# Patient Record
Sex: Female | Born: 1985 | Race: White | Hispanic: No | Marital: Married | Smoking: Never smoker
Health system: Southern US, Community
[De-identification: ages and names within clinical notes are randomized; demographics above are authoritative.]

## PROBLEM LIST (undated history)

## (undated) DIAGNOSIS — O1490 Unspecified pre-eclampsia, unspecified trimester: Secondary | ICD-10-CM

## (undated) DIAGNOSIS — Z9289 Personal history of other medical treatment: Secondary | ICD-10-CM

## (undated) DIAGNOSIS — Z8041 Family history of malignant neoplasm of ovary: Secondary | ICD-10-CM

## (undated) DIAGNOSIS — O139 Gestational [pregnancy-induced] hypertension without significant proteinuria, unspecified trimester: Secondary | ICD-10-CM

## (undated) DIAGNOSIS — K219 Gastro-esophageal reflux disease without esophagitis: Secondary | ICD-10-CM

## (undated) HISTORY — DX: Family history of malignant neoplasm of ovary: Z80.41

## (undated) HISTORY — DX: Unspecified pre-eclampsia, unspecified trimester: O14.90

## (undated) HISTORY — DX: Personal history of other medical treatment: Z92.89

---

## 2005-03-12 ENCOUNTER — Emergency Department: Payer: Self-pay | Admitting: Internal Medicine

## 2005-08-08 ENCOUNTER — Ambulatory Visit: Payer: Self-pay | Admitting: Internal Medicine

## 2006-07-23 ENCOUNTER — Emergency Department: Payer: Self-pay | Admitting: Emergency Medicine

## 2006-12-31 ENCOUNTER — Observation Stay: Payer: Self-pay

## 2007-04-11 ENCOUNTER — Observation Stay: Payer: Self-pay | Admitting: Obstetrics and Gynecology

## 2007-04-13 ENCOUNTER — Inpatient Hospital Stay: Payer: Self-pay | Admitting: Obstetrics and Gynecology

## 2007-04-13 DIAGNOSIS — O1490 Unspecified pre-eclampsia, unspecified trimester: Secondary | ICD-10-CM

## 2007-04-13 DIAGNOSIS — D696 Thrombocytopenia, unspecified: Secondary | ICD-10-CM

## 2008-09-28 ENCOUNTER — Ambulatory Visit: Payer: Self-pay | Admitting: Obstetrics and Gynecology

## 2011-01-07 ENCOUNTER — Emergency Department: Payer: Self-pay | Admitting: Emergency Medicine

## 2012-10-25 ENCOUNTER — Emergency Department: Payer: Self-pay | Admitting: Emergency Medicine

## 2012-10-26 LAB — CBC
HCT: 38.5 % (ref 35.0–47.0)
HGB: 12.9 g/dL (ref 12.0–16.0)
MCH: 29.7 pg (ref 26.0–34.0)
MCHC: 33.5 g/dL (ref 32.0–36.0)
MCV: 89 fL (ref 80–100)
Platelet: 188 10*3/uL (ref 150–440)
RBC: 4.34 10*6/uL (ref 3.80–5.20)
RDW: 12.6 % (ref 11.5–14.5)

## 2012-10-26 LAB — BASIC METABOLIC PANEL
Anion Gap: 7 (ref 7–16)
BUN: 7 mg/dL (ref 7–18)
Chloride: 108 mmol/L — ABNORMAL HIGH (ref 98–107)
Co2: 25 mmol/L (ref 21–32)
Osmolality: 277 (ref 275–301)
Potassium: 3.3 mmol/L — ABNORMAL LOW (ref 3.5–5.1)

## 2014-02-19 DIAGNOSIS — I341 Nonrheumatic mitral (valve) prolapse: Secondary | ICD-10-CM | POA: Insufficient documentation

## 2014-02-19 DIAGNOSIS — R0681 Apnea, not elsewhere classified: Secondary | ICD-10-CM | POA: Insufficient documentation

## 2014-02-19 DIAGNOSIS — R0609 Other forms of dyspnea: Secondary | ICD-10-CM | POA: Insufficient documentation

## 2014-03-04 DIAGNOSIS — G43909 Migraine, unspecified, not intractable, without status migrainosus: Secondary | ICD-10-CM | POA: Insufficient documentation

## 2014-03-04 DIAGNOSIS — B001 Herpesviral vesicular dermatitis: Secondary | ICD-10-CM | POA: Insufficient documentation

## 2014-03-19 DIAGNOSIS — Z87898 Personal history of other specified conditions: Secondary | ICD-10-CM | POA: Insufficient documentation

## 2014-04-15 ENCOUNTER — Ambulatory Visit: Payer: Self-pay | Admitting: Emergency Medicine

## 2014-07-23 LAB — OB RESULTS CONSOLE RUBELLA ANTIBODY, IGM: Rubella: IMMUNE

## 2014-07-23 LAB — OB RESULTS CONSOLE RPR: RPR: NONREACTIVE

## 2014-07-23 LAB — OB RESULTS CONSOLE VARICELLA ZOSTER ANTIBODY, IGG: VARICELLA IGG: IMMUNE

## 2014-07-23 LAB — OB RESULTS CONSOLE ABO/RH: RH Type: POSITIVE

## 2014-07-23 LAB — OB RESULTS CONSOLE HIV ANTIBODY (ROUTINE TESTING): HIV: NONREACTIVE

## 2014-07-23 LAB — OB RESULTS CONSOLE ANTIBODY SCREEN: ANTIBODY SCREEN: NEGATIVE

## 2014-07-23 LAB — OB RESULTS CONSOLE HEPATITIS B SURFACE ANTIGEN: Hepatitis B Surface Ag: NEGATIVE

## 2014-11-04 ENCOUNTER — Encounter: Payer: Self-pay | Admitting: Obstetrics & Gynecology

## 2015-01-05 ENCOUNTER — Observation Stay: Payer: Self-pay

## 2015-01-05 LAB — URINALYSIS, COMPLETE
BILIRUBIN, UR: NEGATIVE
BLOOD: NEGATIVE
Glucose,UR: NEGATIVE mg/dL (ref 0–75)
LEUKOCYTE ESTERASE: NEGATIVE
Nitrite: NEGATIVE
Ph: 7 (ref 4.5–8.0)
Protein: NEGATIVE
RBC,UR: <1 /HPF (ref 0–5)
Specific Gravity: 1.01 (ref 1.003–1.030)
WBC UR: 1 /HPF (ref 0–5)

## 2015-01-05 LAB — FETAL FIBRONECTIN
Appearance: NORMAL
Fetal Fibronectin: NEGATIVE

## 2015-01-08 LAB — BETA STREP CULTURE(ARMC)

## 2015-02-22 NOTE — H&P (Signed)
L&D Evaluation:  History:  HPI 29 year old G2 P1001 with EDC=03/01/2015 by LMP=05/25/2014 and c/w  ultrasound presents at 2632 wk1 day with c/o frequent contractions that began last night. Denies recent IC, trauma, dysuria, diarrhea, constipation. No bleeding. Increased discharge today, but no burning or itching. Baby active. [facility] for prior C-section (preeclampsia with thrombocytopenia), thickened nuchal fold at 21 week scan, with a normal first trimester screen, and a hx of pp depression.   Presents with contractions   Patient's Surgical History Previous C-Section  2008   Medications Pre Natal Vitamins   Allergies NKDA   Social History none   Family History Non-Contributory   ROS:  ROS see HPI   Exam:  Vital Signs 129/65   General no apparent distress   Mental Status clear   Chest clear   Heart normal sinus rhythm, no murmur/gallop/rubs   Abdomen gravid, tender with contractions   Fetal Position cephalic   Edema no edema   Pelvic no external lesions, cervix closed and thick, and OOP   Mebranes Intact   FHT normal rate with no decels, 130 with accels to 150s   Ucx q2-4   Other wet prep negative   Impression:  Impression IUP at 32 1/7 weeks with threatened PTL.   Plan:  Plan EFM/NST, monitor contractions and for cervical change, IV hydration already begun. FFN and GBS done. UA pending. Terbutaline 0.6225mcg subcut x1 given.   Electronic Signatures: Trinna BalloonGutierrez, Jamita Mckelvin L (CNM)  (Signed 24-Mar-16 13:30)  Authored: L&D Evaluation   Last Updated: 24-Mar-16 13:30 by Trinna BalloonGutierrez, Christy Ehrsam L (CNM)

## 2015-02-23 ENCOUNTER — Encounter
Admission: RE | Admit: 2015-02-23 | Discharge: 2015-02-23 | Disposition: A | Discharge status: Home / Self Care | Payer: BLUE CROSS/BLUE SHIELD | Source: Ambulatory Visit | Attending: Obstetrics & Gynecology | Admitting: Obstetrics & Gynecology

## 2015-02-23 HISTORY — DX: Gastro-esophageal reflux disease without esophagitis: K21.9

## 2015-02-23 LAB — DIFFERENTIAL
BASOS PCT: 0 %
Basophils Absolute: 0 10*3/uL (ref 0–0.1)
Eosinophils Absolute: 0 10*3/uL (ref 0–0.7)
Eosinophils Relative: 0 %
Lymphocytes Relative: 13 %
Lymphs Abs: 0.9 10*3/uL — ABNORMAL LOW (ref 1.0–3.6)
MONO ABS: 0.7 10*3/uL (ref 0.2–0.9)
Monocytes Relative: 10 %
NEUTROS ABS: 5.3 10*3/uL (ref 1.4–6.5)
Neutrophils Relative %: 77 %

## 2015-02-23 LAB — CBC
HEMATOCRIT: 31.4 % — AB (ref 35.0–47.0)
HEMOGLOBIN: 9.9 g/dL — AB (ref 12.0–16.0)
MCH: 24.7 pg — ABNORMAL LOW (ref 26.0–34.0)
MCHC: 31.5 g/dL — ABNORMAL LOW (ref 32.0–36.0)
MCV: 78.6 fL — ABNORMAL LOW (ref 80.0–100.0)
Platelets: 114 10*3/uL — ABNORMAL LOW (ref 150–440)
RBC: 4 MIL/uL (ref 3.80–5.20)
RDW: 15.1 % — AB (ref 11.5–14.5)
WBC: 6.8 10*3/uL (ref 3.6–11.0)

## 2015-02-23 LAB — TYPE AND SCREEN
ABO/RH(D): A POS
Antibody Screen: NEGATIVE

## 2015-02-23 LAB — ABO/RH: ABO/RH(D): A POS

## 2015-02-23 MED ORDER — BUPIVACAINE 0.5 % ON-Q PUMP DUAL CATH 300 ML: 300.0000 mL | INJECTION | Status: DC

## 2015-02-23 MED ORDER — CEFOXITIN SODIUM-DEXTROSE 2-2.2 GM-% IV SOLR (PREMIX): 2.0000 g | Freq: Once | INTRAVENOUS | Status: DC

## 2015-02-23 MED ORDER — LACTATED RINGERS IV SOLN: INTRAVENOUS | Status: DC

## 2015-02-23 NOTE — Discharge Instructions (Signed)
Herbert SetaHeather D Haupt   Your procedure is scheduled on: 02/24/15   [facility]  Cesarean delivery is the birth of a baby through a cut (incision) in the abdomen and womb (uterus).  LET [facility] PROVIDER KNOW ABOUT:  All medicines you are taking, including vitamins, herbs, eye drops, creams, and over-the-counter medicines.  Previous problems you or members of your family have had with the use of anesthetics.  Any blood disorders you have.  Previous surgeries you have had.  Medical conditions you have.  Any allergies you have.  Complicationsinvolving the pregnancy. RISKS AND COMPLICATIONS  Generally, this is a safe procedure. However, as with any procedure, complications can occur. Possible complications include:  Bleeding.  Infection.  Blood clots.  Injury to surrounding organs.  Problems with anesthesia.  Injury to the baby. BEFORE THE PROCEDURE   You may be given an antacid medicine to drink. This will prevent acid contents in your stomach from going into your lungs if you vomit during the surgery.  You may be given an antibiotic medicine to prevent infection. PROCEDURE   Hair may be removed from your pubic area and your lower abdomen. This is to prevent infection in the incision site.  A tube (Foley catheter) will be placed in your bladder to drain your urine from your bladder into a bag. This keeps your bladder empty during surgery.  An IV tube will be placed in your vein.  You may be given medicine to numb the lower half of your body (regional anesthetic). If you were in labor, you may have already had an epidural in place which can be used in both labor and cesarean delivery. You may possibly be given medicine to make you sleep (general anesthetic) though this is not as common.  An incision will be made in your abdomen that extends to your uterus. There are 2 basic kinds of incisions:  The horizontal  (transverse) incision. Horizontal incisions are from side to side and are used for most routine cesarean deliveries.  The vertical incision. The vertical incision is from the top of the abdomen to the bottom and is less commonly used. It is often done for women who have a serious complication (extreme prematurity) or under emergency situations.  The horizontal and vertical incisions may both be used at the same time. However, this is very uncommon.  An incision is then made in your uterus to deliver the baby.  Your baby will then be delivered.  Both incisions are then closed with absorbable stitches. AFTER THE PROCEDURE   If you were awake during the surgery, you will see your baby right away. If you were asleep, you will see your baby as soon as you are awake.  You may breastfeed your baby after surgery.  You may be able to get up and walk the same day as the surgery. If you need to stay in bed for a period of time, you will receive help to turn, cough, and take deep breaths after surgery. This helps prevent lung problems such as pneumonia.  Do not get out of bed alone the first time after surgery. You will need help getting out of bed until you are able to do this by yourself.  You may be able to shower the day after your cesarean delivery. After the bandage (dressing) is taken off the incision site, a nurse will assist you to  shower if you would like help.  You will have pneumatic compression hose placed on your lower legs. This is done to prevent blood clots. When you are up and walking regularly, they will no longer be necessary.  Do not cross your legs when you sit.  Save any blood clots that you pass. If you pass a clot while on the toilet, do not flush it. Call for the nurse. Tell the nurse if you think you are bleeding too much or passing too many clots.  You will be given medicine as needed. Let your health care providers know if you are hurting. You may also be given an  antibiotic to prevent an infection.  Your IV tube will be taken out when you are drinking a reasonable amount of fluids. The Foley catheter is taken out when you are up and walking.  If your blood type is Rh negative and your baby's blood type is Rh positive, you will be given a shot of anti-D immune globulin. This shot prevents you from having Rh problems with a future pregnancy. You should get the shot even if you had your tubes tied (tubal ligation).  If you are allowed to take the baby for a walk, place the baby in the bassinet and push it. Do not carry your baby in your arms. Document Released: 10/01/2005 Document Revised: 07/22/2013 Document Reviewed: 04/22/2013 [facility] Patient Information 2015 [place], [place]. This information is not intended to replace advice given to you by your health care provider. Make sure you discuss any questions you have with your health care provider. Incentive Spirometer An incentive spirometer is a tool that can help keep your lungs clear and active. This tool measures how well you are filling your lungs with each breath. Taking long, deep breaths may help reverse or decrease the chance of developing breathing (pulmonary) problems (especially infection) following:  Surgery of the chest or abdomen.  Surgery if you have a history of smoking or a lung problem.  A long period of time when you are unable to move or be active. BEFORE THE PROCEDURE   If the spirometer includes an indicator to show your best effort, your nurse or respiratory therapist will set it to a desired goal.  If possible, sit up straight or lean slightly forward. Try not to slouch.  Hold the incentive spirometer in an upright position. INSTRUCTIONS FOR USE   Sit on the edge of your bed if possible, or sit up as far as you can in bed or on a chair.  Hold the incentive spirometer in an upright position.  Breathe out normally.  Place the mouthpiece in your mouth and seal your lips  tightly around it.  Breathe in slowly and as deeply as possible, raising the piston or the ball toward the top of the column.  Hold your breath for 3-5 seconds or for as long as possible. Allow the piston or ball to fall to the bottom of the column.  Remove the mouthpiece from your mouth and breathe out normally.  Rest for a few seconds and repeat Steps 1 through 7 at least 10 times every 1-2 hours when you are awake. Take your time and take a few normal breaths between deep breaths.  The spirometer may include an indicator to show your best effort. Use the indicator as a goal to work toward during each repetition.  After each set of 10 deep breaths, practice coughing to be sure your lungs are clear. If you have  an incision (the cut made at the time of surgery), support your incision when coughing by placing a pillow or rolled-up towels firmly against it. Once you are able to get out of bed, walk around indoors and cough well. You may stop using the incentive spirometer when instructed by your caregiver.  RISKS AND COMPLICATIONS  Breathing too quickly may cause dizziness. At an extreme, this could cause you to pass out. Take your time so you do not get dizzy or light-headed.  If you are in pain, you may need to take or ask for pain medication before doing incentive spirometry. It is harder to take a deep breath if you are having pain. AFTER USE  Rest and breathe slowly and easily.  It can be helpful to keep a log of your progress. Your caregiver can provide you with a simple table to help with this. If you are using the spirometer at home, follow these instructions: SEEK MEDICAL CARE IF:   You are having difficultly using the spirometer.  You have trouble using the spirometer as often as instructed.  Your pain medication is not giving enough relief while using the spirometer.  You develop fever of 100.97F (38.1C) or higher. SEEK IMMEDIATE MEDICAL CARE IF:   You cough up bloody  sputum that had not been present before.  You develop fever of 102F (38.9C) or greater.  You develop worsening pain at or near the incision site. MAKE SURE YOU:   Understand these instructions.  Will watch your condition.  Will get help right away if you are not doing well or get worse. Document Released: 02/11/2007 Document Revised: 02/15/2014 Document Reviewed: 04/14/2007 [facility] Patient Information 2015 [place], [place]. This information is not intended to replace advice given to you by your health care provider. Make sure you discuss any questions you have with your health care provider. Surgical Site Infections FAQs What is a Surgical Site Infection (SSI)? A surgical site infection is an infection that occurs after surgery in the part of the body where the surgery took place. Most patients who have surgery do not develop an infection. However, infections develop in about 1 to 3 out of every 100 patients who have surgery. Some of the common symptoms of a surgical site infection are:  Redness and pain around the area where you had surgery  Drainage of cloudy fluid from your surgical wound  Fever Can SSIs be treated? Yes. Most surgical site infections can be treated with antibiotics. The antibiotic given to you depends on the bacteria (germs) causing the infections. Sometimes patients with SSIs also need another surgery to treat the infection. What are some of the things that hospitals are doing to prevent SSIs? To prevent SSIs, doctors, nurses, and other healthcare providers:  Clean their hands and arms up to their elbows with an antiseptic agent just before the surgery.  Clean their hands with soap and water or an alcohol-based hand rub before and after caring for each patient.  May remove some of your hair immediately before your surgery using electric clippers if the hair is in the same area where the procedure will occur. They should not shave you with a razor.  Wear  special hair covers, masks, gowns, and gloves during surgery to keep the surgery area clean.  Give you antibiotics before your surgery starts. In most cases, you should get antibiotics within 60 minutes before the surgery starts and the antibiotics should be stopped within 24 hours after surgery.  Clean the skin  at the site of your surgery with a special soap that kills germs. What can I do to help prevent SSIs? Before your surgery:  Tell your doctor about other medical problems you may have. Health problems such as allergies, diabetes, and obesity could affect your surgery and your treatment.  Quit smoking. Patients who smoke get more infections. Talk to your doctor about how you can quit before your surgery.  Do not shave near where you will have surgery. Shaving with a razor can irritate your skin and make it easier to develop an infection. At the time of your surgery:  Speak up if someone tries to shave you with a razor before surgery. Ask why you need to be shaved and talk with your surgeon if you have any concerns.  Ask if you will get antibiotics before surgery. After your surgery:  Make sure that your healthcare providers clean their hands before examining you, either with soap and water or an alcohol-based hand rub.  If you do not see your providers clean their hands, please ask them to do so.  Family and friends who visit you should not touch the surgical wound or dressings.  Family and friends should clean their hands with soap and water or an alcohol-based hand rub before and after visiting you. If you do not see them clean their hands, ask them to clean their hands. What do I need to do when I go home from the hospital?  Before you go home, your doctor or nurse should explain everything you need to know about taking care of your wound. Make sure you understand how to care for your wound before you leave the hospital.  Always clean your hands before and after caring for  your wound.  Before you go home, make sure you know who to contact if you have questions or problems after you get home.  If you have any symptoms of an infection, such as redness and pain at the surgery site, drainage, or fever, call your doctor immediately. If you have additional questions, please ask your doctor or nurse. Developed and co-sponsored by [employer] for [employer] of [place] [phone]); Infectious Diseases Society of America (IDSA); [facility] Association; Association for Professionals in Infection Control and Epidemiology (APIC); Centers for Disease Control and Prevention (CDC); and [employer] Document Released: 10/06/2013 Document Reviewed: 10/06/2013 [facility] Patient Information 2015 [place], [place]. This information is not intended to replace advice given to you by your health care provider. Make sure you discuss any questions you have with your health care provider. Trudie Reed             [address]             [place], [place] [address]  Call this number if you have problems the morning of surgery: [phone]   Remember:   Do not eat food or drink liquids after midnight.     Do not wear jewelry, make-up or nail polish.  Do not wear lotions, powders, or perfumes. You may wear deodorant.  Do not shave prior to surgery.  Do not bring valuables to the hospital.  [facility] is not responsible for any            belongings or valuables.                Contacts, dentures or bridgework may not be worn  into surgery.  Leave suitcase in the car. After surgery it may be brought to your room.  For patients admitted to the hospital, discharge time is determined by your             treatment team.               Special Instructions: Preparing the skin before Cesarean Section              To help prevent the risk of infection at your surgical site, we are providing             you with rinse-free Sage 2%  Chlorhexidine Gluconate (HCG) disposable             wipes.               The night before surgery:              1. Shower or bathe with warm water             2. Do not apply lotion or perfume             3. Wait one hour after shower, skin should be dry and cool             4. Open Sage wipe package - 2 disposable cloths are inside             5. Wipe the lower abdomen from the pubic line to the navel and hip bone to hip             bone with one cloth             6. Use the second cloth to wipe the front of the upper thighs             7. Allow the area to dry for one minute. DO NOT RINSE             8. Skin may feel "tacky" for several minutes             9. Dress in freshly laundered, clean clothes           10. Do not shower the morning of surgery    Please read over the following fact sheets that you were given: Coughing and            Deep Breathing and Surgical Site Infection Prevention

## 2015-02-24 ENCOUNTER — Inpatient Hospital Stay: Payer: BLUE CROSS/BLUE SHIELD | Admitting: Anesthesiology

## 2015-02-24 ENCOUNTER — Inpatient Hospital Stay
Admission: RE | Admit: 2015-02-24 | Payer: BLUE CROSS/BLUE SHIELD | Source: Ambulatory Visit | Admitting: Obstetrics & Gynecology

## 2015-02-24 ENCOUNTER — Inpatient Hospital Stay
Admission: RE | Admit: 2015-02-24 | Discharge: 2015-02-26 | DRG: 766 | Disposition: A | Discharge status: Home / Self Care | Payer: BLUE CROSS/BLUE SHIELD | Attending: Obstetrics & Gynecology | Admitting: Obstetrics & Gynecology

## 2015-02-24 ENCOUNTER — Encounter
Admission: RE | Disposition: A | Discharge status: Home / Self Care | Payer: Self-pay | Source: Home / Self Care | Attending: Obstetrics & Gynecology

## 2015-02-24 DIAGNOSIS — O34219 Maternal care for unspecified type scar from previous cesarean delivery: Secondary | ICD-10-CM

## 2015-02-24 DIAGNOSIS — Z302 Encounter for sterilization: Secondary | ICD-10-CM

## 2015-02-24 DIAGNOSIS — Z3490 Encounter for supervision of normal pregnancy, unspecified, unspecified trimester: Secondary | ICD-10-CM

## 2015-02-24 DIAGNOSIS — O3421 Maternal care for scar from previous cesarean delivery: Principal | ICD-10-CM | POA: Diagnosis present

## 2015-02-24 DIAGNOSIS — Z3A39 39 weeks gestation of pregnancy: Secondary | ICD-10-CM | POA: Diagnosis present

## 2015-02-24 DIAGNOSIS — F329 Major depressive disorder, single episode, unspecified: Secondary | ICD-10-CM | POA: Insufficient documentation

## 2015-02-24 DIAGNOSIS — Z98891 History of uterine scar from previous surgery: Secondary | ICD-10-CM

## 2015-02-24 DIAGNOSIS — F32A Depression, unspecified: Secondary | ICD-10-CM | POA: Insufficient documentation

## 2015-02-24 HISTORY — DX: Gestational (pregnancy-induced) hypertension without significant proteinuria, unspecified trimester: O13.9

## 2015-02-24 LAB — HIV ANTIBODY (ROUTINE TESTING W REFLEX): HIV Screen 4th Generation wRfx: NONREACTIVE

## 2015-02-24 LAB — RPR: RPR Ser Ql: NONREACTIVE

## 2015-02-24 SURGERY — Surgical Case
Anesthesia: Spinal

## 2015-02-24 MED ORDER — DIPHENHYDRAMINE HCL 50 MG/ML IJ SOLN: 12.5000 mg | INTRAMUSCULAR | Status: DC | PRN

## 2015-02-24 MED ORDER — DIPHENHYDRAMINE HCL 25 MG PO CAPS: 25.0000 mg | ORAL_CAPSULE | Freq: Four times a day (QID) | ORAL | Status: DC | PRN

## 2015-02-24 MED ORDER — SIMETHICONE 80 MG PO CHEW: 80.0000 mg | CHEWABLE_TABLET | ORAL | Status: DC | PRN

## 2015-02-24 MED ORDER — NALBUPHINE HCL 10 MG/ML IJ SOLN: 5.0000 mg | Freq: Once | INTRAMUSCULAR | Status: AC | PRN

## 2015-02-24 MED ORDER — HYDROCODONE-ACETAMINOPHEN 5-325 MG PO TABS: 1.0000 | ORAL_TABLET | ORAL | Status: AC | PRN

## 2015-02-24 MED ORDER — KETOROLAC TROMETHAMINE 15 MG/ML IJ SOLN
INTRAMUSCULAR | Status: AC
  Administered 2015-02-24: 15 mg via INTRAVENOUS
  Filled 2015-02-24: qty 1

## 2015-02-24 MED ORDER — OXYCODONE-ACETAMINOPHEN 5-325 MG PO TABS
1.0000 | ORAL_TABLET | ORAL | Status: DC | PRN
  Administered 2015-02-26 (×3): 1 via ORAL
  Filled 2015-02-24 (×4): qty 1

## 2015-02-24 MED ORDER — SODIUM CHLORIDE 0.9 % IV SOLN
6.2500 mg | Freq: Four times a day (QID) | INTRAVENOUS | Status: DC | PRN
  Filled 2015-02-24: qty 1
  Filled 2015-02-24: qty 0.25

## 2015-02-24 MED ORDER — BUPIVACAINE 0.25 % ON-Q PUMP DUAL CATH 400 ML: 400.0000 mL | INJECTION | Status: DC

## 2015-02-24 MED ORDER — LACTATED RINGERS IV SOLN
INTRAVENOUS | Status: DC
  Administered 2015-02-25 – 2015-02-26 (×3): via INTRAVENOUS

## 2015-02-24 MED ORDER — ONDANSETRON HCL 4 MG/2ML IJ SOLN
4.0000 mg | Freq: Three times a day (TID) | INTRAMUSCULAR | Status: DC | PRN
  Administered 2015-02-26: 4 mg via INTRAVENOUS
  Filled 2015-02-24: qty 2

## 2015-02-24 MED ORDER — MEPERIDINE HCL 25 MG/ML IJ SOLN: 6.2500 mg | INTRAMUSCULAR | Status: DC | PRN

## 2015-02-24 MED ORDER — DIBUCAINE 1 % RE OINT: 1.0000 "application " | TOPICAL_OINTMENT | RECTAL | Status: DC | PRN

## 2015-02-24 MED ORDER — CEFOXITIN SODIUM-DEXTROSE 2-2.2 GM-% IV SOLR (PREMIX)
INTRAVENOUS | Status: AC
  Administered 2015-02-24: 2000 mg via INTRAVENOUS
  Filled 2015-02-24: qty 50

## 2015-02-24 MED ORDER — CEFOXITIN SODIUM-DEXTROSE 2-2.2 GM-% IV SOLR (PREMIX)
2.0000 g | Freq: Once | INTRAVENOUS | Status: AC
  Administered 2015-02-24: 2000 mg via INTRAVENOUS

## 2015-02-24 MED ORDER — LACTATED RINGERS IV SOLN
INTRAVENOUS | Status: DC
  Administered 2015-02-24 (×2): via INTRAVENOUS

## 2015-02-24 MED ORDER — CITRIC ACID-SODIUM CITRATE 334-500 MG/5ML PO SOLN
ORAL | Status: AC
  Administered 2015-02-24: 30 mL via ORAL
  Filled 2015-02-24: qty 15

## 2015-02-24 MED ORDER — HYDROMORPHONE HCL 1 MG/ML IJ SOLN: 0.2500 mg | INTRAMUSCULAR | Status: DC | PRN

## 2015-02-24 MED ORDER — BUPIVACAINE 0.25 % ON-Q PUMP DUAL CATH 400 ML
INJECTION | Status: AC
  Filled 2015-02-24: qty 400

## 2015-02-24 MED ORDER — BUPIVACAINE HCL 0.25 % IJ SOLN
10.0000 mL | Freq: Once | INTRAMUSCULAR | Status: DC
Start: 2015-02-24 — End: 2015-02-24
  Filled 2015-02-24: qty 10

## 2015-02-24 MED ORDER — FERROUS SULFATE 325 (65 FE) MG PO TABS
325.0000 mg | ORAL_TABLET | Freq: Two times a day (BID) | ORAL | Status: DC
  Administered 2015-02-25 – 2015-02-26 (×3): 325 mg via ORAL
  Filled 2015-02-24 (×4): qty 1

## 2015-02-24 MED ORDER — FENTANYL CITRATE (PF) 100 MCG/2ML IJ SOLN
25.0000 ug | INTRAMUSCULAR | Status: AC | PRN
  Administered 2015-02-24 (×4): 25 ug via INTRAVENOUS

## 2015-02-24 MED ORDER — KETOROLAC TROMETHAMINE 15 MG/ML IJ SOLN
15.0000 mg | Freq: Four times a day (QID) | INTRAMUSCULAR | Status: AC
  Administered 2015-02-24 – 2015-02-25 (×2): 15 mg via INTRAVENOUS
  Filled 2015-02-24: qty 1

## 2015-02-24 MED ORDER — MENTHOL 3 MG MT LOZG
1.0000 | LOZENGE | OROMUCOSAL | Status: DC | PRN
Start: 2015-02-24 — End: 2015-02-26
  Filled 2015-02-24: qty 9

## 2015-02-24 MED ORDER — WITCH HAZEL-GLYCERIN EX PADS: 1.0000 "application " | MEDICATED_PAD | CUTANEOUS | Status: DC | PRN

## 2015-02-24 MED ORDER — OXYCODONE-ACETAMINOPHEN 5-325 MG PO TABS
2.0000 | ORAL_TABLET | ORAL | Status: DC | PRN
  Administered 2015-02-26: 1 via ORAL

## 2015-02-24 MED ORDER — PHENYLEPHRINE HCL 10 MG/ML IJ SOLN
INTRAMUSCULAR | Status: DC | PRN
  Administered 2015-02-24 (×2): 100 ug via INTRAVENOUS

## 2015-02-24 MED ORDER — ONDANSETRON HCL 4 MG/2ML IJ SOLN
INTRAMUSCULAR | Status: AC
  Administered 2015-02-24: 4 mg via INTRAVENOUS
  Filled 2015-02-24: qty 2

## 2015-02-24 MED ORDER — FENTANYL CITRATE (PF) 100 MCG/2ML IJ SOLN
INTRAMUSCULAR | Status: AC
  Filled 2015-02-24: qty 2

## 2015-02-24 MED ORDER — OXYTOCIN 40 UNITS IN LACTATED RINGERS INFUSION - SIMPLE MED
INTRAVENOUS | Status: AC
  Administered 2015-02-24: 100 mL via INTRAVENOUS
  Administered 2015-02-24: 62.5 mL/h via INTRAVENOUS
  Filled 2015-02-24: qty 1000

## 2015-02-24 MED ORDER — DIPHENHYDRAMINE HCL 25 MG PO CAPS: 25.0000 mg | ORAL_CAPSULE | ORAL | Status: DC | PRN

## 2015-02-24 MED ORDER — BUPIVACAINE HCL (PF) 0.5 % IJ SOLN
INTRAMUSCULAR | Status: AC
  Administered 2015-02-24: 10 mL via INTRAMUSCULAR
  Filled 2015-02-24: qty 30

## 2015-02-24 MED ORDER — NALOXONE HCL 1 MG/ML IJ SOLN
1.0000 ug/kg/h | INTRAVENOUS | Status: DC | PRN
  Filled 2015-02-24: qty 2

## 2015-02-24 MED ORDER — SIMETHICONE 80 MG PO CHEW
80.0000 mg | CHEWABLE_TABLET | ORAL | Status: DC
  Administered 2015-02-25: 80 mg via ORAL
  Filled 2015-02-24: qty 1

## 2015-02-24 MED ORDER — SCOPOLAMINE 1 MG/3DAYS TD PT72: 1.0000 | MEDICATED_PATCH | Freq: Once | TRANSDERMAL | Status: DC

## 2015-02-24 MED ORDER — SIMETHICONE 80 MG PO CHEW
80.0000 mg | CHEWABLE_TABLET | Freq: Three times a day (TID) | ORAL | Status: DC
  Administered 2015-02-25 – 2015-02-26 (×4): 80 mg via ORAL
  Filled 2015-02-24 (×4): qty 1

## 2015-02-24 MED ORDER — SODIUM CHLORIDE 0.9 % IJ SOLN: 3.0000 mL | INTRAMUSCULAR | Status: DC | PRN

## 2015-02-24 MED ORDER — NALOXONE HCL 0.4 MG/ML IJ SOLN: 0.4000 mg | INTRAMUSCULAR | Status: DC | PRN

## 2015-02-24 MED ORDER — IBUPROFEN 600 MG PO TABS
600.0000 mg | ORAL_TABLET | Freq: Four times a day (QID) | ORAL | Status: DC
  Administered 2015-02-26 (×2): 600 mg via ORAL
  Filled 2015-02-24 (×2): qty 1

## 2015-02-24 MED ORDER — PRENATAL MULTIVITAMIN CH
1.0000 | ORAL_TABLET | Freq: Every day | ORAL | Status: DC
  Administered 2015-02-25 – 2015-02-26 (×2): 1 via ORAL
  Filled 2015-02-24 (×2): qty 1

## 2015-02-24 MED ORDER — SENNOSIDES-DOCUSATE SODIUM 8.6-50 MG PO TABS: 2.0000 | ORAL_TABLET | ORAL | Status: DC

## 2015-02-24 MED ORDER — OXYTOCIN 40 UNITS IN LACTATED RINGERS INFUSION - SIMPLE MED
62.5000 mL/h | INTRAVENOUS | Status: AC
  Administered 2015-02-24 (×2): 62.5 mL/h via INTRAVENOUS
  Filled 2015-02-24: qty 1000

## 2015-02-24 MED ORDER — BUPIVACAINE HCL (PF) 0.5 % IJ SOLN
10.0000 mL | Freq: Once | INTRAMUSCULAR | Status: DC
  Administered 2015-02-24: 10 mL via INTRAMUSCULAR

## 2015-02-24 MED ORDER — IBUPROFEN 600 MG PO TABS
600.0000 mg | ORAL_TABLET | Freq: Four times a day (QID) | ORAL | Status: DC | PRN
  Administered 2015-02-25 (×2): 600 mg via ORAL
  Filled 2015-02-24 (×2): qty 1

## 2015-02-24 MED ORDER — POTASSIUM CITRATE-CITRIC ACID 1100-334 MG/5ML PO SOLN
10.0000 meq | Freq: Once | ORAL | Status: AC
  Filled 2015-02-24: qty 5

## 2015-02-24 MED ORDER — MORPHINE SULFATE 2 MG/ML IJ SOLN: 1.0000 mg | Freq: Once | INTRAMUSCULAR | Status: AC | PRN

## 2015-02-24 MED ORDER — ZOLPIDEM TARTRATE 5 MG PO TABS: 5.0000 mg | ORAL_TABLET | Freq: Every evening | ORAL | Status: DC | PRN

## 2015-02-24 MED ORDER — LACTATED RINGERS IV SOLN
Freq: Once | INTRAVENOUS | Status: AC
  Administered 2015-02-24: 09:00:00 via INTRAVENOUS

## 2015-02-24 MED ORDER — NALBUPHINE HCL 10 MG/ML IJ SOLN: 5.0000 mg | INTRAMUSCULAR | Status: DC | PRN

## 2015-02-24 MED ORDER — IBUPROFEN 600 MG PO TABS: 600.0000 mg | ORAL_TABLET | Freq: Four times a day (QID) | ORAL | Status: DC

## 2015-02-24 MED ORDER — LANOLIN HYDROUS EX OINT: 1.0000 "application " | TOPICAL_OINTMENT | CUTANEOUS | Status: DC | PRN

## 2015-02-24 MED ORDER — ACETAMINOPHEN 325 MG PO TABS
650.0000 mg | ORAL_TABLET | ORAL | Status: DC | PRN
  Administered 2015-02-25 – 2015-02-26 (×4): 650 mg via ORAL
  Filled 2015-02-24 (×4): qty 2

## 2015-02-24 MED ORDER — PROMETHAZINE HCL 25 MG/ML IJ SOLN
6.2500 mg | Freq: Four times a day (QID) | INTRAMUSCULAR | Status: AC | PRN
  Administered 2015-02-24: 6.25 mg via INTRAVENOUS

## 2015-02-24 MED ORDER — ONDANSETRON HCL 4 MG/2ML IJ SOLN
4.0000 mg | Freq: Once | INTRAMUSCULAR | Status: AC | PRN
  Administered 2015-02-24: 4 mg via INTRAVENOUS

## 2015-02-24 SURGICAL SUPPLY — 22 items
BARRIER ADHS 3X4 INTERCEED (GAUZE/BANDAGES/DRESSINGS) ×3 IMPLANT
CANISTER SUCT 3000ML (MISCELLANEOUS) ×3 IMPLANT
CATH KIT ON-Q SILVERSOAK 5IN (CATHETERS) ×6 IMPLANT
CHLORAPREP W/TINT 26ML (MISCELLANEOUS) ×6 IMPLANT
GLOVE SKINSENSE NS SZ8.0 LF (GLOVE) ×2
GLOVE SKINSENSE STRL SZ8.0 LF (GLOVE) ×1 IMPLANT
GOWN STRL REUS W/ TWL LRG LVL3 (GOWN DISPOSABLE) ×1 IMPLANT
GOWN STRL REUS W/ TWL XL LVL3 (GOWN DISPOSABLE) ×2 IMPLANT
GOWN STRL REUS W/TWL LRG LVL3 (GOWN DISPOSABLE) ×2
GOWN STRL REUS W/TWL XL LVL3 (GOWN DISPOSABLE) ×4
LIQUID BAND (GAUZE/BANDAGES/DRESSINGS) ×3 IMPLANT
NS IRRIG 1000ML POUR BTL (IV SOLUTION) ×3 IMPLANT
PACK C SECTION AR (MISCELLANEOUS) ×3 IMPLANT
PAD GROUND ADULT SPLIT (MISCELLANEOUS) ×3 IMPLANT
PAD OB MATERNITY 4.3X12.25 (PERSONAL CARE ITEMS) ×3 IMPLANT
PAD PREP 24X41 OB/GYN DISP (PERSONAL CARE ITEMS) ×3 IMPLANT
SUT MAXON ABS #0 GS21 30IN (SUTURE) ×6 IMPLANT
SUT VIC AB 1 CT1 36 (SUTURE) ×3 IMPLANT
SUT VIC AB 2-0 CT1 36 (SUTURE) ×3 IMPLANT
SUT VIC AB 2-0 SH 27 (SUTURE) ×2
SUT VIC AB 2-0 SH 27XBRD (SUTURE) ×1 IMPLANT
SUT VIC AB 4-0 FS2 27 (SUTURE) ×3 IMPLANT

## 2015-02-24 NOTE — H&P (Signed)
History and Physical Interval Note:  02/24/2015 9:11 AM  Maria Mcneil  has presented today for surgery, with the diagnosis of pregnancy, multiparity  The various methods of treatment have been discussed with the patient and family. After consideration of risks, benefits and other options for treatment, the patient has consented to  Procedure(s): CESAREAN SECTION WITH BILATERAL TUBAL LIGATION (N/A) as a surgical intervention .  The patient's history has been reviewed, patient examined, no change in status, stable for surgery.  Pt has the following beta blocker history-  Not taking Beta Blocker.  I have reviewed the patient's chart and labs.  Questions were answered to the patient's satisfaction.       Valor [place] Renae FicklePAUL

## 2015-02-24 NOTE — Anesthesia Preprocedure Evaluation (Addendum)
Anesthesia Evaluation  Patient identified by MRN, date of birth, ID band Patient awake    Reviewed: Allergy & Precautions, H&P , NPO status , Patient's Chart, lab work & pertinent test results, reviewed documented beta blocker date and time   Airway Mallampati: II  TM Distance: >3 FB Neck ROM: full    Dental no notable dental hx.    Pulmonary neg pulmonary ROS,  breath sounds clear to auscultation  Pulmonary exam normal       Cardiovascular Exercise Tolerance: Good hypertension, negative cardio ROS  Rhythm:regular Rate:Normal     Neuro/Psych negative neurological ROS  negative psych ROS   GI/Hepatic negative GI ROS, Neg liver ROS, GERD-  ,  Endo/Other  negative endocrine ROS  Renal/GU negative Renal ROS  negative genitourinary   Musculoskeletal   Abdominal   Peds  Hematology negative hematology ROS (+)   Anesthesia Other Findings   Reproductive/Obstetrics (+) Pregnancy                            Anesthesia Physical Anesthesia Plan  ASA: II  Anesthesia Plan: Spinal   Post-op Pain Management:    Induction:   Airway Management Planned:   Additional Equipment:   Intra-op Plan:   Post-operative Plan:   Informed Consent: I have reviewed the patients History and Physical, chart, labs and discussed the procedure including the risks, benefits and alternatives for the proposed anesthesia with the patient or authorized representative who has indicated his/her understanding and acceptance.     Plan Discussed with: CRNA  Anesthesia Plan Comments:        Anesthesia Quick Evaluation

## 2015-02-24 NOTE — Lactation Note (Signed)
This note was copied from the chart of Maria Mcneil. Lactation Consultation Note  Patient Name: Maria Perry MountHeather Mcneil ZOXWR'UToday's Date: 02/24/2015 Reason for consult: Initial assessment   Maternal Data   Mother is ambivalent about breast feeding. Feeding Feeding Type: Breast Fed Length of feed: 15 min  LATCH Score/Interventions Latch: Repeated attempts needed to sustain latch, nipple held in mouth throughout feeding, stimulation needed to elicit sucking reflex. Intervention(s): Adjust position;Assist with latch;Breast massage;Breast compression  Audible Swallowing: A few with stimulation  Type of Nipple: Everted at rest and after stimulation  Comfort (Breast/Nipple): Soft / non-tender     Hold (Positioning): Assistance needed to correctly position infant at breast and maintain latch.  LATCH Score: 7  Lactation Tools Discussed/Used     Consult Status      Trudee GripCarolyn P Savaya Hakes 02/24/2015, 4:56 PM

## 2015-02-24 NOTE — Transfer of Care (Signed)
Immediate Anesthesia Transfer of Care Note  Patient: Maria Mcneil  Procedure(s) Performed: Procedure(s): CESAREAN SECTION WITH BILATERAL TUBAL LIGATION (N/A)  Patient Location: PACU  Anesthesia Type:Spinal  Level of Consciousness: awake, alert  and oriented  Airway & Oxygen Therapy: Patient Spontanous Breathing  Post-op Assessment: Report given to RN and Post -op Vital signs reviewed and stable  Post vital signs: Reviewed and stable  Last Vitals:  Filed Vitals:   02/24/15 1119  BP:   Pulse:   Temp: 36.6 C  Resp:     Complications: No apparent anesthesia complications

## 2015-02-24 NOTE — Anesthesia Procedure Notes (Signed)
Spinal  Start time: 02/24/2015 10:00 AM Staffing Performed by: anesthesiologist  Preanesthetic Checklist Completed: patient identified, site marked, surgical consent, pre-op evaluation, timeout performed, IV checked, risks and benefits discussed and monitors and equipment checked Spinal Block Patient position: sitting Prep: Betadine Patient monitoring: heart rate, continuous pulse ox, blood pressure and cardiac monitor Approach: midline Location: L4-5 Injection technique: single-shot Needle Needle type: Whitacre and Introducer  Needle gauge: 24 G Needle length: 9 cm Assessment Sensory level: T4 Additional Notes Negative paresthesia. Negative blood return. Positive free-flowing CSF. Expiration date of kit checked and confirmed. Patient tolerated procedure well, without complications. Pt with documented hx of low plts and 114 yesterday am.  No preeclampsia risk factors.

## 2015-02-24 NOTE — Progress Notes (Signed)
Obstetric Preoperative History and Physical  Maria ChinaHeather D Lansing is a 29 y.o. G2P1001 with IUP at 8277w2d presenting for presenting for scheduled repeat cesarean section.  No acute concerns.   Prenatal Course Source of Care: [facility]\  with onset of care at 6 weeks Pregnancy complications or risks: Patient Active Problem List   Diagnosis Date Noted  . Term pregnancy, repeat 02/24/2015  . Previous cesarean delivery, delivered 02/24/2015   She plans to breastfeed She desires no method for postpartum contraception.   Prenatal labs and studies: ABO, Rh: --/--/A POS (05/11 1107) Antibody: NEG (05/11 1106) Rubella: Immune (10/09 0000) RPR: Non Reactive (05/11 1106)  HBsAg: Negative (10/09 0000)  HIV: Non-reactive (10/09 0000)  GBS: neg  Genetic screening normal Anatomy US normal  Prenatal Transfer Tool  Maternal Diabetes: No Genetic Screening: Normal Maternal Ultrasounds/Referrals: Normal Fetal Ultrasounds or other Referrals:  None Maternal Substance Abuse:  No Significant Maternal Medications:  None Significant Maternal Lab Results: None  Past Medical History  Diagnosis Date  . GERD (gastroesophageal reflux disease)   . Pregnancy induced hypertension     Past Surgical History  Procedure Laterality Date  . Cesarean section      OB History  Gravida Para Term Preterm AB SAB TAB Ectopic Multiple Living  2 1 1       1     # Outcome Date GA Lbr Len/2nd Weight Sex Delivery Anes PTL Lv  2 Current           1 Term 02/23/07    F CS-LTranv Gen  Y     Complications: Fetal Intolerance,Pre-eclampsia,Thrombocytopenia      History   Social History  . Marital Status: Married    Spouse Name: N/A  . Number of Children: N/A  . Years of Education: N/A   Social History Main Topics  . Smoking status: Never Smoker   . Smokeless tobacco: Never Used  . Alcohol Use: No  . Drug Use: No  . Sexual Activity:    Partners: Male    Birth Control/ Protection: None   Other Topics Concern  .  None   Social History Narrative    History reviewed. No pertinent family history.  Prescriptions prior to admission  Medication Sig Dispense Refill Last Dose  . Prenatal Vit-Fe Fumarate-FA (PRENATAL MULTIVITAMIN) TABS tablet Take 1 tablet by mouth daily at 12 noon.     . ranitidine (ZANTAC) 150 MG tablet Take 150 mg by mouth 2 (two) times daily.       No Known Allergies  Review of Systems: Negative except for what is mentioned in HPI.  Physical Exam: BP 123/80 mmHg  Pulse 79  Temp(Src) 98.1 F (36.7 C) (Oral)  Resp 20  Ht 5\' 2"  (1.575 m)  Wt 78.019 kg (172 lb)  BMI 31.45 kg/m2  LMP 05/25/2014 FHR by Doppler: 130 bpm GENERAL: Well-developed, well-nourished female in no acute distress.  LUNGS: Clear to auscultation bilaterally.  HEART: Regular rate and rhythm. ABDOMEN: Soft, nontender, nondistended, gravid,  well-healed Pfannenstiel incision. PELVIC: Deferred EXTREMITIES: Nontender, no edema, 2+ distal pulses.   Pertinent Labs/Studies:   Results for orders placed or performed during the hospital encounter of 02/23/15 (from the past 72 hour(s))  CBC     Status: Abnormal   Collection Time: 02/23/15 11:06 AM  Result Value Ref Range   WBC 6.8 3.6 - 11.0 K/uL   RBC 4.00 3.80 - 5.20 MIL/uL   Hemoglobin 9.9 (L) 12.0 - 16.0 g/dL   HCT 16.131.4 (L)  35.0 - 47.0 %   MCV 78.6 (L) 80.0 - 100.0 fL   MCH 24.7 (L) 26.0 - 34.0 pg   MCHC 31.5 (L) 32.0 - 36.0 g/dL   RDW 40.915.1 (H) 81.111.5 - 91.414.5 %   Platelets 114 (L) 150 - 440 K/uL  Differential     Status: Abnormal   Collection Time: 02/23/15 11:06 AM  Result Value Ref Range   Neutrophils Relative % 77 %   Neutro Abs 5.3 1.4 - 6.5 K/uL   Lymphocytes Relative 13 %   Lymphs Abs 0.9 (L) 1.0 - 3.6 K/uL   Monocytes Relative 10 %   Monocytes Absolute 0.7 0.2 - 0.9 K/uL   Eosinophils Relative 0 %   Eosinophils Absolute 0.0 0 - 0.7 K/uL   Basophils Relative 0 %   Basophils Absolute 0.0 0 - 0.1 K/uL  Type and screen for Red Blood Exchange      Status: None   Collection Time: 02/23/15 11:06 AM  Result Value Ref Range   ABO/RH(D) A POS    Antibody Screen NEG    Sample Expiration 02/26/2015   RPR     Status: None   Collection Time: 02/23/15 11:06 AM  Result Value Ref Range   RPR Ser Ql Non Reactive Non Reactive    Comment: (NOTE) Performed At: [facility] [address] [place], [place] [id number] Mila HomerHancock William F MD [id number]:[phone]   HIV antibody     Status: None   Collection Time: 02/23/15 11:06 AM  Result Value Ref Range   HIV Screen 4th Generation wRfx Non Reactive Non Reactive    Comment: (NOTE) Performed At: [facility] [address] [place], [place] [id number] Mila HomerHancock William F MD WN:[id number]:[phone]   ABO/Rh     Status: None   Collection Time: 02/23/15 11:07 AM  Result Value Ref Range   ABO/RH(D) A POS     Assessment and Plan :Maria Mcneil is a 29 y.o. G2P1001 at 2930w2d being admitted being admitted for scheduled cesarean section. The risks of cesarean section discussed with the patient included but were not limited to: bleeding which may require transfusion or reoperation; infection which may require antibiotics; injury to bowel, bladder, ureters or other surrounding organs; injury to the fetus; need for additional procedures including hysterectomy in the event of a life-threatening hemorrhage; placental abnormalities wth subsequent pregnancies, incisional problems, thromboembolic phenomenon and other postoperative/anesthesia complications. The patient concurred with the proposed plan, giving informed written consent for the procedure. Patient has been NPO since last night she will remain NPO for procedure. Anesthesia and OR aware. Preoperative prophylactic antibiotics and SCDs ordered on call to the OR. To OR when ready.

## 2015-02-24 NOTE — Op Note (Signed)
Cesarean Section Procedure Note Indications: Prior CS, Term Pregnancy  Pre-operative Diagnosis: Intrauterine pregnancy 1140w2d ;  Prior CS, Term Pregnancy Post-operative Diagnosis: same, delivered. Procedure: Low Transverse Cesarean Section Surgeon: Annamarie MajorPaul Ulysess Witz, MD Assistant(s): Dr Elesa MassedWard Anesthesia: Spinal anesthesia Estimated Blood Loss:500 Complications: None; patient tolerated the procedure well. Disposition: PACU - hemodynamically stable. Condition: stable  Findings: A female infant in the cephalic presentation. Amniotic fluid - Clear  Birth weight  8-3 lbs Apgars of 8 and 9.  Intact placenta with a three-vessel cord. Grossly normal uterus, tubes and ovaries bilaterally. Min intraabdominal adhesions were noted.  Procedure Details   The patient was taken to Operating Room, identified as the correct patient and the procedure verified as C-Section Delivery. A Time Out was held and the above information confirmed. After induction of anesthesia, the patient was draped and prepped in the usual sterile manner. A Pfannenstiel incision was made and carried down through the subcutaneous tissue to the fascia. Fascial incision was made and extended transversely with the Mayo scissors. The fascia was separated from the underlying rectus tissue superiorly and inferiorly. The peritoneum was identified and entered bluntly. Peritoneal incision was extended longitudinally. The utero-vesical peritoneal reflection was incised transversely and a bladder flap was created digitally.  A low transverse hysterotomy was made. The fetus was delivered atraumatically. The umbilical cord was clamped x2 and cut and the infant was handed to the awaiting pediatricians. The placenta was removed intact and appeared normal with a 3-vessel cord.  The uterus was exteriorized and cleared of all clot and debris. The hysterotomy was closed with running sutures of 0 Vicryl suture. A second imbricating layer was placed with the same  suture. Excellent hemostasis was observed.   The left Fallopian tube was identified, grasped with the Babcock clamps, lifted to the skin incision and followed out distally to the fimbriae. An avascular midsection of the tube approximately 3-4cm from the cornua was grasped with the babcock clamps and brought into a knuckle at the skin incision. The tube was double ligated with 2-0 Vicryl suture and the intervening portion of tube was transected and removed. Excellent hemostasis was noted and the tube was returned to the abdomen. Attention was then turned to the right fallopian tube after confirmation of identification by tracing the tube out to the fimbriae. The same procedure was then performed on the right Fallopian tube. Again, excellent hemostasis was noted at the end of the procedure.  The uterus was returned to the abdomen. The pelvis was irrigated and again, excellent hemostasis was noted.  The On Q Pain pump System was then placed.  Trocars were placed through the abdominal wall into the subfascial space and these were used to thread the silver soaker cathaters into place.The rectus fascia was then reapproximated with running sutures of Maxon, with careful placement not to incorporate the cathaters. Subcutaneous tissues are then irrigated with saline and hemostasis assured.  Skin is then closed with 4-0 vicryl suture in a subcuticular fashion followed by skin adhesive. The cathaters are flushed each with 5 mL of Bupivicaine and stabilized into place with dressing. Instrument, sponge, and needle counts were correct prior to the abdominal closure and at the conclusion of the case.  The patient tolerated the procedure well and was transferred to the recovery room in stable condition.

## 2015-02-25 LAB — HEMOGLOBIN: Hemoglobin: 8.8 g/dL — ABNORMAL LOW (ref 12.0–16.0)

## 2015-02-25 LAB — SURGICAL PATHOLOGY

## 2015-02-25 NOTE — Progress Notes (Signed)
Subjective: Postpartum Day 1: Cesarean Delivery Patient reports no problems voiding.  Pain and bleeding well controlled. Pt up in the bathroom and reports feeling dizzy and weak . Nurse currently with pt with ammonia to help wake the patient up.  Once patient was placed back in bed pt began to wake up.  Objective: Vital signs in last 24 hours: Temp:  [97.7 F (36.5 C)-98.7 F (37.1 C)] 98 F (36.7 C) (05/13 0822) Pulse Rate:  [62-125] 93 (05/13 1057) Resp:  [18-20] 18 (05/13 1057) BP: (95-128)/(61-82) 117/67 mmHg (05/13 1057) SpO2:  [98 %-100 %] 100 % (05/13 1057)  Physical Exam:  General: pale and diaphoretic Lochia: appropriate Uterine Fundus: firm Incision: c/d/i DVT Evaluation: No evidence of DVT seen on physical exam.   Recent Labs  02/23/15 1106 02/25/15 0640  HGB 9.9* 8.8*  HCT 31.4*  --     Assessment/Plan: Status post Cesarean section. Doing well postoperatively.  Continue current care. Will keep IV and IVF going until the next time patient gets out of bed.  Monitor BP.   Jannet MantisSubudhi,  Terrian Sentell 02/25/2015, 11:49 AM

## 2015-02-25 NOTE — Anesthesia Postprocedure Evaluation (Signed)
  Anesthesia Post-op Note  Patient: Maria Mcneil  Procedure(s) Performed: Procedure(s): CESAREAN SECTION WITH BILATERAL TUBAL LIGATION (N/A)  Anesthesia type:Spinal  Patient location: 346  Post pain: Pain level controlled  Post assessment: Post-op Vital signs reviewed, Patient's Cardiovascular Status Stable, Respiratory Function Stable, Patent Airway and No signs of Nausea or vomiting  Post vital signs: Reviewed and stable  Last Vitals:  Filed Vitals:   02/25/15 0518  BP: 120/80  Pulse: 88  Temp: 36.5 C  Resp: 18    Level of consciousness: awake, alert  and patient cooperative  Complications: No apparent anesthesia complications

## 2015-02-25 NOTE — Anesthesia Post-op Follow-up Note (Signed)
  Anesthesia Pain Follow-up Note  Patient: Juleen ChinaHeather D Raigoza  Day #: 1  Date of Follow-up: 02/25/2015 Time: 7:37 AM  Last Vitals:  Filed Vitals:   02/25/15 0518  BP: 120/80  Pulse: 88  Temp: 36.5 C  Resp: 18    Level of Consciousness: alert  Pain: mild   Side Effects:None  Catheter Site Exam:clean, dry, no drainage  Plan: D/C from anesthesia care  Rica MastBachich,  Finlay Godbee M

## 2015-02-25 NOTE — Progress Notes (Signed)
Pt up to bathroom at 1032.  Pt experienced syncope episode while sitting on toilet.  Courtney Subudhi CNM entered pt's during incident.  Ammonia used. Pt returned to bed via wheelchair with help from E.Q RN and CNM, two sets of vitals taken VS within normal limits.  Per CNM restart IV and LR at 16725ml/hr. Pt stable back in bed resting at this time.

## 2015-02-26 MED ORDER — IBUPROFEN 600 MG PO TABS: 600.0000 mg | ORAL_TABLET | Freq: Four times a day (QID) | ORAL | Status: DC | PRN

## 2015-02-26 MED ORDER — OXYCODONE-ACETAMINOPHEN 5-325 MG PO TABS: 1.0000 | ORAL_TABLET | ORAL | Status: DC | PRN

## 2015-02-26 MED ORDER — WHITE PETROLATUM GEL
Status: AC
  Filled 2015-02-26: qty 5

## 2015-02-26 MED ORDER — FERROUS SULFATE 325 (65 FE) MG PO TABS: 325.0000 mg | ORAL_TABLET | Freq: Two times a day (BID) | ORAL | Status: DC

## 2015-02-26 NOTE — Progress Notes (Signed)
Discharge order in by Dr. Bonney AidStaebler. Instructions reviewed with patient. Pt v/u of all instructions. Prescription given to pt. ID bands of mom and infant matched. Escorted by nursing via w/c in stable condition, infant in arms.

## 2015-02-26 NOTE — Discharge Instructions (Signed)
Cesarean Delivery  Cesarean delivery is the birth of a baby through a cut (incision) in the abdomen and womb (uterus).  LET YOUR HEALTH CARE PROVIDER KNOW ABOUT:  All medicines you are taking, including vitamins, herbs, eye drops, creams, and over-the-counter medicines.  Previous problems you or members of your family have had with the use of anesthetics.  Any blood disorders you have.  Previous surgeries you have had.  Medical conditions you have.  Any allergies you have.  Complicationsinvolving the pregnancy. RISKS AND COMPLICATIONS  Generally, this is a safe procedure. However, as with any procedure, complications can occur. Possible complications include:  Bleeding.  Infection.  Blood clots.  Injury to surrounding organs.  Problems with anesthesia.  Injury to the baby. BEFORE THE PROCEDURE   You may be given an antacid medicine to drink. This will prevent acid contents in your stomach from going into your lungs if you vomit during the surgery.  You may be given an antibiotic medicine to prevent infection. PROCEDURE   Hair may be removed from your pubic area and your lower abdomen. This is to prevent infection in the incision site.  A tube (Foley catheter) will be placed in your bladder to drain your urine from your bladder into a bag. This keeps your bladder empty during surgery.  An IV tube will be placed in your vein.  You may be given medicine to numb the lower half of your body (regional anesthetic). If you were in labor, you may have already had an epidural in place which can be used in both labor and cesarean delivery. You may possibly be given medicine to make you sleep (general anesthetic) though this is not as common.  An incision will be made in your abdomen that extends to your uterus. There are 2 basic kinds of incisions:  The horizontal (transverse) incision. Horizontal incisions are from side to side and are used for most routine cesarean  deliveries.  The vertical incision. The vertical incision is from the top of the abdomen to the bottom and is less commonly used. It is often done for women who have a serious complication (extreme prematurity) or under emergency situations.  The horizontal and vertical incisions may both be used at the same time. However, this is very uncommon.  An incision is then made in your uterus to deliver the baby.  Your baby will then be delivered.  Both incisions are then closed with absorbable stitches. AFTER THE PROCEDURE   If you were awake during the surgery, you will see your baby right away. If you were asleep, you will see your baby as soon as you are awake.  You may breastfeed your baby after surgery.  You may be able to get up and walk the same day as the surgery. If you need to stay in bed for a period of time, you will receive help to turn, cough, and take deep breaths after surgery. This helps prevent lung problems such as pneumonia.  Do not get out of bed alone the first time after surgery. You will need help getting out of bed until you are able to do this by yourself.  You may be able to shower the day after your cesarean delivery. After the bandage (dressing) is taken off the incision site, a nurse will assist you to shower if you would like help.  You will have pneumatic compression hose placed on your lower legs. This is done to prevent blood clots. When you are up   and walking regularly, they will no longer be necessary.  Do not cross your legs when you sit.  Save any blood clots that you pass. If you pass a clot while on the toilet, do not flush it. Call for the nurse. Tell the nurse if you think you are bleeding too much or passing too many clots.  You will be given medicine as needed. Let your health care providers know if you are hurting. You may also be given an antibiotic to prevent an infection.  Your IV tube will be taken out when you are drinking a reasonable  amount of fluids. The Foley catheter is taken out when you are up and walking.  If your blood type is Rh negative and your baby's blood type is Rh positive, you will be given a shot of anti-D immune globulin. This shot prevents you from having Rh problems with a future pregnancy. You should get the shot even if you had your tubes tied (tubal ligation).  If you are allowed to take the baby for a walk, place the baby in the bassinet and push it. Do not carry your baby in your arms. Document Released: 10/01/2005 Document Revised: 07/22/2013 Document Reviewed: 04/22/2013 ExitCare Patient Information 2015 [employer]. This information is not intended to replace advice given to you by your health care provider. Make sure you discuss any questions you have with your health care provider.  

## 2015-02-26 NOTE — Discharge Summary (Signed)
Obstetric Discharge Summary Reason for Admission: cesarean section Prenatal Procedures: NST Intrapartum Procedures: cesarean: low cervical, transverse Postpartum Procedures: none Complications-Operative and Postpartum: none HEMOGLOBIN  Date Value Ref Range Status  02/25/2015 8.8* 12.0 - 16.0 g/dL Final   HGB  Date Value Ref Range Status  10/26/2012 12.9 12.0-16.0 g/dL Final   HCT  Date Value Ref Range Status  02/23/2015 31.4* 35.0 - 47.0 % Final  10/26/2012 38.5 35.0-47.0 % Final    Physical Exam:  General: alert Lochia: appropriate Uterine Fundus: firm Incision: healing well DVT Evaluation: No evidence of DVT seen on physical exam.  Discharge Diagnoses: Term Pregnancy-delivered  Discharge Information: Date: 02/26/2015 Activity: pelvic rest Diet: routine Medications: PNV, Ibuprofen, Iron and Percocet Condition: stable Instructions: refer to practice specific booklet Discharge to: home Follow-up Information    Follow up with Letitia LibraHARRIS,ROBERT PAUL, MD In 1 week.   Specialty:  Obstetrics and Gynecology   Why:  For wound re-check   Contact information:   [address] [place] [address] [phone]       Newborn Data: Live born female  Birth Weight: 8 lb 3.2 oz (3719 g) APGAR: 8, 9  Home with mother.  Maria Mcneil 02/26/2015, 10:53 AM

## 2015-02-27 ENCOUNTER — Encounter: Payer: Self-pay | Admitting: Obstetrics & Gynecology

## 2015-04-29 LAB — HM PAP SMEAR: HM Pap smear: NEGATIVE

## 2016-01-03 ENCOUNTER — Ambulatory Visit
Admission: EM | Admit: 2016-01-03 | Discharge: 2016-01-03 | Disposition: A | Discharge status: Home / Self Care | Payer: BLUE CROSS/BLUE SHIELD | Attending: Family Medicine | Admitting: Family Medicine

## 2016-01-03 ENCOUNTER — Encounter: Payer: Self-pay | Admitting: Emergency Medicine

## 2016-01-03 DIAGNOSIS — B349 Viral infection, unspecified: Secondary | ICD-10-CM

## 2016-01-03 LAB — RAPID INFLUENZA A&B ANTIGENS (ARMC ONLY): INFLUENZA A (ARMC): NEGATIVE

## 2016-01-03 LAB — RAPID STREP SCREEN (MED CTR MEBANE ONLY): Streptococcus, Group A Screen (Direct): NEGATIVE

## 2016-01-03 LAB — RAPID INFLUENZA A&B ANTIGENS: Influenza B (ARMC): NEGATIVE

## 2016-01-03 MED ORDER — LIDOCAINE VISCOUS 2 % MT SOLN: 15.0000 mL | Freq: Three times a day (TID) | OROMUCOSAL | Status: DC | PRN

## 2016-01-03 MED ORDER — OSELTAMIVIR PHOSPHATE 75 MG PO CAPS: 75.0000 mg | ORAL_CAPSULE | Freq: Two times a day (BID) | ORAL | Status: DC

## 2016-01-03 MED ORDER — ACETAMINOPHEN 500 MG PO TABS
1000.0000 mg | ORAL_TABLET | Freq: Once | ORAL | Status: AC
  Administered 2016-01-03: 1000 mg via ORAL

## 2016-01-03 NOTE — ED Notes (Signed)
Patient c/o sore throat, cough, HAs, and bodyaches that started yesterday.

## 2016-01-03 NOTE — Discharge Instructions (Signed)
Take medication as prescribed. Rest. Drink plenty of fluids. Take over the counter tylenol or ibuprofen as needed.  ° °Follow up with your primary care physician this week as needed. Return to Urgent care for new or worsening concerns.  °Viral Infections °A viral infection can be caused by different types of viruses. Most viral infections are not serious and resolve on their own. However, some infections may cause severe symptoms and may lead to further complications. °SYMPTOMS °Viruses can frequently cause: °· Minor sore throat. °· Aches and pains. °· Headaches. °· Runny nose. °· Different types of rashes. °· Watery eyes. °· Tiredness. °· Cough. °· Loss of appetite. °· Gastrointestinal infections, resulting in nausea, vomiting, and diarrhea. °These symptoms do not respond to antibiotics because the infection is not caused by bacteria. However, you might catch a bacterial infection following the viral infection. This is sometimes called a "superinfection." Symptoms of such a bacterial infection may include: °· Worsening sore throat with pus and difficulty swallowing. °· Swollen neck glands. °· Chills and a high or persistent fever. °· Severe headache. °· Tenderness over the sinuses. °· Persistent overall ill feeling (malaise), muscle aches, and tiredness (fatigue). °· Persistent cough. °· Yellow, green, or brown mucus production with coughing. °HOME CARE INSTRUCTIONS  °· Only take over-the-counter or prescription medicines for pain, discomfort, diarrhea, or fever as directed by your caregiver. °· Drink enough water and fluids to keep your urine clear or pale yellow. Sports drinks can provide valuable electrolytes, sugars, and hydration. °· Get plenty of rest and maintain proper nutrition. Soups and broths with crackers or rice are fine. °SEEK IMMEDIATE MEDICAL CARE IF:  °· You have severe headaches, shortness of breath, chest pain, neck pain, or an unusual rash. °· You have uncontrolled vomiting, diarrhea, or you  are unable to keep down fluids. °· You or your child has an oral temperature above 102° F (38.9° C), not controlled by medicine. °· Your baby is older than 3 months with a rectal temperature of 102° F (38.9° C) or higher. °· Your baby is 3 months old or Mathieu with a rectal temperature of 100.4° F (38° C) or higher. °MAKE SURE YOU:  °· Understand these instructions. °· Will watch your condition. °· Will get help right away if you are not doing well or get worse. °  °This information is not intended to replace advice given to you by your health care provider. Make sure you discuss any questions you have with your health care provider. °  °Document Released: 07/11/2005 Document Revised: 12/24/2011 Document Reviewed: 03/09/2015 °Elsevier Interactive Patient Education ©2016 [employer] ° °

## 2016-01-03 NOTE — ED Provider Notes (Signed)
[facility]  ____________________________________________  Time seen: Approximately 3:50 PM  I have reviewed the triage vital signs and the nursing notes.   HISTORY  Chief Complaint Headache; Sore Throat; and Cough    HPI Maria Mcneil is a 30 y.o. female presents with a complaint of sore throat, runny nose, nasal congestion, cough, body aches and fever that started yesterday afternoon. Reports continues to eat and drink well. 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 Also reports friends child just recently had the flu. Reports has taken over-the-counter ibuprofen earlier today.  Denies chest pain or shortness of breath, chest pain with deep breath, palpitations, syncope, near syncope, dizziness, weakness, fevers, abdominal pain, weakness, extremity pain, extremity swelling.  Patient's last menstrual period was 12/13/2015 (approximate). Denies chance of pregnancy.    Past Medical History  Diagnosis Date  . GERD (gastroesophageal reflux disease)   . Pregnancy induced hypertension     Patient Active Problem List   Diagnosis Date Noted  . Term pregnancy, repeat 02/24/2015  . Previous cesarean delivery, delivered 02/24/2015  . Clinical depression 02/24/2015  . S/P cesarean section 02/24/2015  . H/O disease 03/19/2014  . Headache, migraine 03/04/2014  . 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 Breathlessness on exertion 02/19/2014  . 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 Cesarean section    . Cesarean section with bilateral tubal ligation N/A 02/24/2015    Procedure: CESAREAN SECTION WITH BILATERAL TUBAL LIGATION;  Surgeon: Nadara Mustard, MD;  Location: [facility] ORS;  Service: Obstetrics;  Laterality: N/A;    Current Outpatient Rx  Name  Route  Sig  Dispense  Refill             .           Marland Kitchen           .             Allergies Review of patient's allergies indicates no known  allergies.  History reviewed. No pertinent family history.  Social History Social History  Substance Use Topics  . Smoking status: Never Smoker   . Smokeless tobacco: Never Used  . Alcohol Use: No    Review of Systems Constitutional: Positive subjective fevers. Eyes: No visual changes. ENT: Positive runny nose, nasal congestion, sore throat and cough. Cardiovascular: Denies chest pain. Respiratory: Denies shortness of breath. Gastrointestinal: No abdominal pain.  No nausea, no vomiting.  No diarrhea.  No constipation. Genitourinary: Negative for dysuria. Musculoskeletal: Negative for back pain. Skin: Negative for rash. Neurological: Negative for headaches, focal weakness or numbness.  10-point ROS otherwise negative.  ____________________________________________   PHYSICAL EXAM:  VITAL SIGNS: ED Triage Vitals  Enc Vitals Group     BP 01/03/16 1458 123/82 mmHg     Pulse Rate 01/03/16 1458 100     Resp -- 18     Temp 01/03/16 1458 101.1 F (38.4 C)     Temp Source 01/03/16 1458 Tympanic     SpO2 01/03/16 1458 99 %     Weight 01/03/16 1458 138 lb (62.596 kg)     Height 01/03/16 1458  (1.575 m)     Head Cir --      Peak Flow --      Pain Score 01/03/16 1501 4     Pain Loc --      Pain Edu? --      Excl. in GC? --  Today's Vitals   01/03/16 1458 01/03/16 1501 01/03/16 1559  BP: 123/82    Pulse: 100  100  Temp: 101.1 F (38.4 C)  98.6 F (37 C)  TempSrc: Tympanic  Oral  Resp:   20  Height: 5\' 2"  (1.575 m)    Weight: 138 lb (62.596 kg)    SpO2: 99%    PainSc:  4  0-No pain    Constitutional: Alert and oriented. Well appearing and in no acute distress. Eyes: Conjunctivae are normal. PERRL. EOMI. Head: Atraumatic. No sinus tenderness to palpation. No swelling. No erythema.  Ears: no erythema, normal TMs bilaterally.   Nose:Nasal congestion with clear rhinorrhea  Mouth/Throat: Mucous membranes are moist. Mild pharyngeal erythema. No tonsillar  swelling or exudate.  Neck: No stridor.  No cervical spine tenderness to palpation. Hematological/Lymphatic/Immunilogical: No cervical lymphadenopathy. Cardiovascular: Normal rate, regular rhythm. Grossly normal heart sounds.  Good peripheral circulation. Respiratory: Normal respiratory effort.  No retractions. Lungs CTAB.No wheezes, rales or rhonchi. Good air movement.  Gastrointestinal: Soft and nontender. Normal Bowel sounds. No CVA tenderness. Musculoskeletal: No lower or upper extremity tenderness nor edema. No cervical, thoracic or lumbar tenderness to palpation. Neurologic:  Normal speech and language. No gross focal neurologic deficits are appreciated. No gait instability. Skin:  Skin is warm, dry and intact. No rash noted. Psychiatric: Mood and affect are normal. Speech and behavior are normal.  ____________________________________________   LABS (all labs ordered are listed, but only abnormal results are displayed)  Labs Reviewed  RAPID INFLUENZA A&B ANTIGENS ([facility] ONLY)  RAPID STREP SCREEN (NOT [facility])  CULTURE, GROUP A STREP [facility])    INITIAL IMPRESSION / ASSESSMENT AND PLAN / ED COURSE  Pertinent labs & imaging results that were available during my care of the patient were reviewed by me and considered in my medical decision making (see chart for details).  Very well-appearing patient. No acute distress. One day of runny nose, nasal congestion, sore throat, body aches and fever. Lungs clear throughout. Abdomen soft and nontender. Very well-appearing patient. Suspect viral infection such as influenza.  Influenza negative. However discussed with patient suspect influenza and with positive exposures. Discussed treatment with oral Tamiflu. Patient requests prescription for oral Tamiflu. We'll also treat with oral viscous lidocaine gargles.Discussed indication, risks and benefits of medications with patient. Encouraged rest, fluids, over-the-counter Tylenol or ibuprofen as  needed.  Discussed follow up with Primary care physician this week. Discussed follow up and return parameters including no resolution or any worsening concerns. Patient verbalized understanding and agreed to plan.   ____________________________________________   FINAL CLINICAL IMPRESSION(S) / ED DIAGNOSES  Final diagnoses:  Viral illness      Note: This dictation was prepared with Dragon dictation along with smaller phrase technology. Any transcriptional errors that result from this process are unintentional.    Renford DillsLindsey Gretna Bergin, NP 01/03/16 1648

## 2016-01-06 LAB — CULTURE, GROUP A STREP (THRC)

## 2017-07-26 ENCOUNTER — Ambulatory Visit: Payer: Self-pay | Admitting: Maternal Newborn

## 2017-08-09 ENCOUNTER — Encounter: Payer: Self-pay | Admitting: Maternal Newborn

## 2017-08-09 ENCOUNTER — Ambulatory Visit (INDEPENDENT_AMBULATORY_CARE_PROVIDER_SITE_OTHER): Payer: BLUE CROSS/BLUE SHIELD | Admitting: Maternal Newborn

## 2017-08-09 VITALS — BP 102/62 | HR 95 | Ht 62.0 in | Wt 142.0 lb

## 2017-08-09 DIAGNOSIS — Z124 Encounter for screening for malignant neoplasm of cervix: Secondary | ICD-10-CM

## 2017-08-09 DIAGNOSIS — Z01419 Encounter for gynecological examination (general) (routine) without abnormal findings: Secondary | ICD-10-CM

## 2017-08-09 DIAGNOSIS — R102 Pelvic and perineal pain: Secondary | ICD-10-CM | POA: Diagnosis not present

## 2017-08-09 DIAGNOSIS — N920 Excessive and frequent menstruation with regular cycle: Secondary | ICD-10-CM

## 2017-08-09 NOTE — Progress Notes (Addendum)
Gynecology Annual Exam  PCP: Mickey Farber, MD  Chief Complaint:  Chief Complaint  Patient presents with  . Gynecologic Exam    History of Present Illness: Patient is a 31 y.o. [id number] presents for annual exam. The patient has cycle irregularities and pelvic pain.   LMP: Patient's last menstrual period was 07/21/2017 (exact date). Average Interval: twice monthly, at the beginning and end of each month, since June of this year. Duration of flow: 3-4 days Heavy Menses: no Clots: no Intermenstrual Bleeding: no Postcoital Bleeding: no Dysmenorrhea: yes  The patient is sexually active. She currently uses tubal ligation for contraception. She has dyspareunia.  The patient occasionally performs self breast exams.  There is notable family history of breast or ovarian cancer in her family.  The patient wears seatbelts: yes.   The patient has regular exercise: yes.    The patient denies current symptoms of depression.   Review of Systems  Constitutional: Negative.   HENT: Negative.   Eyes: Negative.   Respiratory: Negative for cough, shortness of breath and wheezing.   Cardiovascular: Negative for chest pain and palpitations.  Gastrointestinal: Negative for abdominal pain and nausea.  Genitourinary:       Pelvic pain and frequent menses  Musculoskeletal: Negative.   Skin: Negative.   Neurological: Positive for headaches.  Endo/Heme/Allergies: Negative.   Psychiatric/Behavioral: Negative.   All other systems reviewed and are negative.   Past Medical History:  Past Medical History:  Diagnosis Date  . Family history of ovarian cancer    PGM  - genetic testing done  . GERD (gastroesophageal reflux disease)   . Pregnancy induced hypertension     Past Surgical History:  Past Surgical History:  Procedure Laterality Date  . CESAREAN SECTION    . CESAREAN SECTION WITH BILATERAL TUBAL LIGATION N/A 02/24/2015   Procedure: CESAREAN SECTION WITH BILATERAL TUBAL LIGATION;   Surgeon: Nadara Mustard, MD;  Location: [facility] ORS;  Service: Obstetrics;  Laterality: N/A;    Gynecologic History:  Patient's last menstrual period was 07/21/2017 (exact date). Contraception: tubal ligation Last Pap: Results were: NIL and HR HPV negative   Obstetric History: [id number]  Family History:  Family History  Problem Relation Age of Onset  . Lung cancer Maternal Grandfather   . Ovarian cancer Paternal Grandmother 16  . Lung cancer Paternal Grandfather     Social History:  Social History   Social History  . Marital status: Married    Spouse name: N/A  . Number of children: N/A  . Years of education: N/A   Occupational History  . Not on file.   Social History Main Topics  . Smoking status: Never Smoker  . Smokeless tobacco: Never Used  . Alcohol use No  . Drug use: No  . Sexual activity: Yes    Partners: Male    Birth control/ protection: None, Surgical     Comment: tubal ligation   Other Topics Concern  . Not on file   Social History Narrative  . No narrative on file    Allergies:  No Known Allergies  Medications: Prior to Admission medications   Medication Sig Start Date End Date Taking? Authorizing Provider  amoxicillin-clavulanate (AUGMENTIN) 875-125 MG tablet TAKE 1 TABLET BY MOUTH TWICE A DAY UNTIL FINISHED 08/02/17  Yes [provider]  valACYclovir (VALTREX) 500 MG tablet Take by mouth.    [provider]    Physical Exam Vitals: Blood pressure 102/62, pulse 95, height 5\' 2"  (1.575 m),  weight 142 lb (64.4 kg), last menstrual period 07/21/2017.  General: NAD HEENT: normocephalic, anicteric Thyroid: no enlargement, no palpable nodules Pulmonary: No increased work of breathing, CTAB Cardiovascular: RRR, distal pulses 2+ Breast: Breast symmetrical, no tenderness, no palpable nodules or masses, no skin or nipple retraction present, no nipple discharge.  No axillary or supraclavicular lymphadenopathy. Abdomen: NABS, soft,  non-tender, non-distended.  Umbilicus without lesions.  No hepatomegaly, splenomegaly or masses palpable. No evidence of hernia  Genitourinary:  External: Normal external female genitalia.  Normal  urethral meatus, normal Bartholin's and Skene's glands.    Vagina: Normal vaginal mucosa, no evidence of  prolapse.    Cervix: Grossly normal in appearance, no bleeding  Uterus: Non-enlarged, retroflexed,  mobile, normal contour.  No CMT  Adnexa: ovaries non-enlarged, no adnexal masses,  bilateral tenderness to palpation, increased on  right  Rectal: deferred  Lymphatic: no evidence of inguinal lymphadenopathy Extremities: no edema, erythema, or tenderness Neurologic: Grossly intact Psychiatric: mood appropriate, affect full  Assessment: 31 y.o. G2P2002 routine annual exam with pelvic pain and frequent menses.  Plan: Problem List Items Addressed This Visit    None    Visit Diagnoses    Encounter for annual routine gynecological examination    -  Primary   Pelvic pain       Relevant Orders   US PELVIS (TRANSABDOMINAL ONLY)   Pap smear for cervical cancer screening       Relevant Orders   Pap IG and HPV (high risk) DNA detection   Unusually frequent menses          1) STI screening was offered and declined.  2) ASCCP guidelines and rationale discussed.  Patient opts for yearly screening interval.  3) Contraception - she has had a tubal ligation  4) Routine healthcare maintenance including cholesterol, diabetes screening discussed; managed by PCP.  5) Family cancer history may be eligible [facility]; she is investigating whether her insurance will pay for testing.  6) Follow up with pelvic ultrasound and plan to address frequent menses and pelvic pain once results are available.  Marcelyn BruinsJacelyn Schmid, CNM 08/09/2017  8:21 PM

## 2017-08-13 LAB — PAP IG AND HPV HIGH-RISK
HPV, high-risk: NEGATIVE
PAP SMEAR COMMENT: 0

## 2017-08-20 ENCOUNTER — Other Ambulatory Visit: Payer: Self-pay | Admitting: Maternal Newborn

## 2017-08-20 ENCOUNTER — Ambulatory Visit (INDEPENDENT_AMBULATORY_CARE_PROVIDER_SITE_OTHER): Payer: BLUE CROSS/BLUE SHIELD

## 2017-08-20 ENCOUNTER — Encounter: Payer: Self-pay | Admitting: Maternal Newborn

## 2017-08-20 ENCOUNTER — Ambulatory Visit: Payer: BLUE CROSS/BLUE SHIELD | Admitting: Maternal Newborn

## 2017-08-20 VITALS — BP 120/80 | HR 72 | Ht 61.0 in | Wt 145.0 lb

## 2017-08-20 DIAGNOSIS — R102 Pelvic and perineal pain: Secondary | ICD-10-CM | POA: Diagnosis not present

## 2017-08-20 DIAGNOSIS — N939 Abnormal uterine and vaginal bleeding, unspecified: Secondary | ICD-10-CM | POA: Diagnosis not present

## 2017-08-20 DIAGNOSIS — N800 Endometriosis of uterus: Secondary | ICD-10-CM | POA: Diagnosis not present

## 2017-08-20 DIAGNOSIS — N8003 Adenomyosis of the uterus: Secondary | ICD-10-CM

## 2017-08-20 DIAGNOSIS — N809 Endometriosis, unspecified: Principal | ICD-10-CM

## 2017-08-20 MED ORDER — LO LOESTRIN FE 1 MG-10 MCG / 10 MCG PO TABS: 1.0000 | ORAL_TABLET | Freq: Every day | ORAL | 3 refills | Status: DC

## 2017-08-20 NOTE — Progress Notes (Signed)
      Gynecology Problem Visit   CC: Follow-up visit with ultrasound for ongoing pelvic pain and abnormal uterine bleeding.  HPI:      [name] is a 31 y.o. [id number] who LMP was 08/11/2017, presents today for a problem visit.  She complains of menometrorrhagia that began in June 2018 and its severity is described as severe. She has regular periods every 15 days and they are associated with severe menstrual cramping.  She also has pelvic pain throughout the month that is worse on the right side.  Previous evaluation: office visit on 08/09/2017. Prior Diagnosis: none. Previous Treatment: NSAIDs at maximum safe doses without improvement.  She is single partner, contraception - tubal ligation.  Hx of STDs: none. She is premenopausal.  PMHx: She  has a past medical history of GERD (gastroesophageal reflux disease), and Pregnancy induced hypertension. Also, has a past surgical history that includes CESAREAN SECTION WITH BILATERAL TUBAL LIGATION (02/24/2015). Family history includes lung cancer in her maternal grandfather and paternal grandfather; ovarian cancer (age of onset: 6240) in her paternal grandmother. She reports that  has never smoked, has never used smokeless tobacco, and does not drink alcohol or use drugs.  She has a current medication list which includes the following prescription(s): ibuprofen and valacyclovir. Also, has No Known Allergies.  ROS:  10 point review of systems negative unless otherwise noted in HPI.  Objective: BP 120/80   Pulse 72   Ht 5\' 1"  (1.549 m)   Wt 145 lb (65.8 kg)   LMP 08/11/2017   BMI 27.40 kg/m    Physical Exam  Constitutional: She is oriented to person, place, and time. No distress.  HENT:  Head: Normocephalic and atraumatic.  Pulmonary/Chest: Effort normal.  Neurological: She is alert and oriented to person, place, and time.  Psychiatric: She has a normal mood and affect. Her behavior is normal. Judgment and thought content normal.     Pelvic ultrasound today viewed with technician showed thickened endometrium and uterine changes likely related to adenomyosis.  ASSESSMENT/PLAN: Adenomyosis with pelvic pain and abnormal uterine bleeding.  Problem List Items Addressed This Visit    None    Visit Diagnoses    Adenomyosis    -  Primary   Abnormal uterine bleeding         Because she has already been taking maximum doses of ibuprofen without symptomatic relief, we discussed beginning OCPs for cycle and symptom control. She is aware that the only definitive method for diagnosing adenomyosis is in retrospect following a hysterectomy. Patient agrees with this plan, and one sample pack was given and Rx sent for Lo LoEstrin.  Follow up in three months to assess whether therapy is efficacious.  A total of 15 minutes were spent in face-to-face contact with the patient during this encounter with over half of that time devoted to counseling and coordination of care.  Marcelyn BruinsJacelyn Schmid, CNM 08/20/2017  8:50 PM

## 2017-09-04 ENCOUNTER — Other Ambulatory Visit: Payer: Self-pay | Admitting: Maternal Newborn

## 2017-09-04 ENCOUNTER — Telehealth: Payer: Self-pay

## 2017-09-04 DIAGNOSIS — N809 Endometriosis, unspecified: Principal | ICD-10-CM

## 2017-09-04 DIAGNOSIS — N8003 Adenomyosis of the uterus: Secondary | ICD-10-CM

## 2017-09-04 MED ORDER — NORETHINDRONE ACETATE 5 MG PO TABS: 5.0000 mg | ORAL_TABLET | Freq: Every day | ORAL | 11 refills | Status: DC

## 2017-09-04 NOTE — Telephone Encounter (Signed)
Spoke with patient after consultation with Dr. Bonney AidStaebler. Will d/c OCP and start norethindrone 5 mg daily. She is aware to pick up new Rx.

## 2017-09-04 NOTE — Telephone Encounter (Signed)
Pt started OCP's 2 wks ago. She has been bleeding since the third day of taking OCP. Inquiring if normal & what to do. [id number]#[phone].

## 2017-09-04 NOTE — Telephone Encounter (Signed)
Spoke w/pt. She states her bleeding in 10x worse than it was when she wasn't on OCP. She is changing/soaking thru a regular tampon q25 min. Pt admits to not taking OCP last night as she is not going to deal with this. Please advise.

## 2017-11-08 ENCOUNTER — Ambulatory Visit: Payer: BLUE CROSS/BLUE SHIELD | Admitting: Maternal Newborn

## 2017-11-12 ENCOUNTER — Encounter: Payer: Self-pay | Admitting: Maternal Newborn

## 2017-11-12 ENCOUNTER — Ambulatory Visit (INDEPENDENT_AMBULATORY_CARE_PROVIDER_SITE_OTHER): Payer: BLUE CROSS/BLUE SHIELD | Admitting: Maternal Newborn

## 2017-11-12 VITALS — BP 110/80 | HR 60 | Ht 62.0 in | Wt 148.0 lb

## 2017-11-12 DIAGNOSIS — N939 Abnormal uterine and vaginal bleeding, unspecified: Secondary | ICD-10-CM | POA: Diagnosis not present

## 2017-11-12 DIAGNOSIS — N800 Endometriosis of uterus: Secondary | ICD-10-CM

## 2017-11-12 DIAGNOSIS — N809 Endometriosis, unspecified: Principal | ICD-10-CM

## 2017-11-12 DIAGNOSIS — N8003 Adenomyosis of the uterus: Secondary | ICD-10-CM

## 2017-11-12 NOTE — Patient Instructions (Signed)
Endometrial Ablation Endometrial ablation is a procedure that destroys the thin inner layer of the lining of the uterus (endometrium). This procedure may be done:  To stop heavy periods.  To stop bleeding that is causing anemia.  To control irregular bleeding.  To treat bleeding caused by small tumors (fibroids) in the endometrium.  This procedure is often an alternative to major surgery, such as removal of the uterus and cervix (hysterectomy). As a result of this procedure:  You may not be able to have children. However, if you are premenopausal (you have not gone through menopause): ? You may still have a small chance of getting pregnant. ? You will need to use a reliable method of birth control after the procedure to prevent pregnancy.  You may stop having a menstrual period, or you may have only a small amount of bleeding during your period. Menstruation may return several years after the procedure.  Tell a health care provider about:  Any allergies you have.  All medicines you are taking, including vitamins, herbs, eye drops, creams, and over-the-counter medicines.  Any problems you or family members have had with the use of anesthetic medicines.  Any blood disorders you have.  Any surgeries you have had.  Any medical conditions you have. What are the risks? Generally, this is a safe procedure. However, problems may occur, including:  A hole (perforation) in the uterus or bowel.  Infection of the uterus, bladder, or vagina.  Bleeding.  Damage to other structures or organs.  An air bubble in the lung (air embolus).  Problems with pregnancy after the procedure.  Failure of the procedure.  Decreased ability to diagnose cancer in the endometrium.  What happens before the procedure?  You will have tests of your endometrium to make sure there are no pre-cancerous cells or cancer cells present.  You may have an ultrasound of the uterus.  You may be given  medicines to thin the endometrium.  Ask your health care provider about: ? Changing or stopping your regular medicines. This is especially important if you take diabetes medicines or blood thinners. ? Taking medicines such as aspirin and ibuprofen. These medicines can thin your blood. Do not take these medicines before your procedure if your doctor tells you not to.  Plan to have someone take you home from the hospital or clinic. What happens during the procedure?  You will lie on an exam table with your feet and legs supported as in a pelvic exam.  To lower your risk of infection: ? Your health care team will wash or sanitize their hands and put on germ-free (sterile) gloves. ? Your genital area will be washed with soap.  An IV tube will be inserted into one of your veins.  You will be given a medicine to help you relax (sedative).  A surgical instrument with a light and camera (resectoscope) will be inserted into your vagina and moved into your uterus. This allows your surgeon to see inside your uterus.  Endometrial tissue will be removed using one of the following methods: ? Radiofrequency. This method uses a radiofrequency-alternating electric current to remove the endometrium. ? Cryotherapy. This method uses extreme cold to freeze the endometrium. ? Heated-free liquid. This method uses a heated saltwater (saline) solution to remove the endometrium. ? Microwave. This method uses high-energy microwaves to heat up the endometrium and remove it. ? Thermal balloon. This method involves inserting a catheter with a balloon tip into the uterus. The balloon tip is   filled with heated fluid to remove the endometrium. The procedure may vary among health care providers and hospitals. What happens after the procedure?  Your blood pressure, heart rate, breathing rate, and blood oxygen level will be monitored until the medicines you were given have worn off.  As tissue healing occurs, you may  notice vaginal bleeding for 4-6 weeks after the procedure. You may also experience: ? Cramps. ? Thin, watery vaginal discharge that is light pink or brown in color. ? A need to urinate more frequently than usual. ? Nausea.  Do not drive for 24 hours if you were given a sedative.  Do not have sex or insert anything into your vagina until your health care provider approves. Summary  Endometrial ablation is done to treat the many causes of heavy menstrual bleeding.  The procedure may be done only after medications have been tried to control the bleeding.  Plan to have someone take you home from the hospital or clinic. This information is not intended to replace advice given to you by your health care provider. Make sure you discuss any questions you have with your health care provider. Document Released: 08/10/2004 Document Revised: 10/18/2016 Document Reviewed: 10/18/2016 [employer]vier Interactive Patient Education  2017 [employer]  

## 2017-11-13 ENCOUNTER — Encounter: Payer: Self-pay | Admitting: Maternal Newborn

## 2017-11-13 DIAGNOSIS — N809 Endometriosis, unspecified: Principal | ICD-10-CM

## 2017-11-13 DIAGNOSIS — N939 Abnormal uterine and vaginal bleeding, unspecified: Secondary | ICD-10-CM | POA: Insufficient documentation

## 2017-11-13 DIAGNOSIS — N8003 Adenomyosis of the uterus: Secondary | ICD-10-CM | POA: Insufficient documentation

## 2017-11-13 NOTE — Progress Notes (Signed)
Obstetrics & Gynecology Office Visit   Chief Complaint:  Chief Complaint  Patient presents with  . Follow-up    MEDICATION F/U - SOOO MANY SIDE EFFECTS; ?SOMETHING ELSE   History of Present Illness: Patient presented in October 2018 with menometrorrhagia with pelvic pain and severe dysmenorrhea. An ultrasound on 08/20/2017 showed a thickened endometrium and uterine changes most likely related to adenomyosis. She was started on LoLo Estrin that day. She discontinued therapy on 09/04/2017 because the symptoms worsened. She began norethindrone 5 mg daily on 11/22 and has been taking it since then. She states that it was been effective in reducing her bleeding and pain and that she now only has some occasional brown spotting. She describes side effects of mood changes, acne, low libido, and weight gain.  Review of Systems: 10 point review of systems negative unless otherwise noted in HPI.  Past Medical History:  Past Medical History:  Diagnosis Date  . Family history of ovarian cancer 06/2014   PGM  - genetic testing done; pat aunt  . GERD (gastroesophageal reflux disease)   . History of Papanicolaou smear of cervix 01/07/2012; 04/29/15   neg; neg  . Pre-eclampsia   . Pregnancy induced hypertension     Past Surgical History:  Past Surgical History:  Procedure Laterality Date  . CESAREAN SECTION  2008  . CESAREAN SECTION WITH BILATERAL TUBAL LIGATION N/A 02/24/2015   Procedure: CESAREAN SECTION WITH BILATERAL TUBAL LIGATION;  Surgeon: Nadara Mustard, MD;  Location: [facility] ORS;  Service: Obstetrics;  Laterality: N/A;    Gynecologic History: Patient's last menstrual period was 10/30/2017.  Obstetric History: [id number]  Family History:  Family History  Problem Relation Age of Onset  . Lung cancer Maternal Grandfather   . Ovarian cancer Paternal Grandmother 7  . Lung cancer Paternal Grandfather     Social History:  Social History   Socioeconomic History  . Marital status:  Married    Spouse name: Not on file  . Number of children: 2  . Years of education: Not on file  . Highest education level: Not on file  Social Needs  . Financial resource strain: Not on file  . Food insecurity - worry: Not on file  . Food insecurity - inability: Not on file  . Transportation needs - medical: Not on file  . Transportation needs - non-medical: Not on file  Occupational History  . Not on file  Tobacco Use  . Smoking status: Never Smoker  . Smokeless tobacco: Never Used  Substance and Sexual Activity  . Alcohol use: No    Alcohol/week: 0.0 oz  . Drug use: No  . Sexual activity: Yes    Partners: Male    Birth control/protection: None, Surgical    Comment: tubal ligation  Other Topics Concern  . Not on file  Social History Narrative  . Not on file    Allergies:  No Known Allergies  Medications: Prior to Admission medications   Medication Sig Start Date End Date Taking? Authorizing Provider  ibuprofen (ADVIL,MOTRIN) 600 MG tablet Take 600 mg by mouth. 02/26/15  Yes [provider]  norethindrone (AYGESTIN) 5 MG tablet Take 1 tablet (5 mg total) by mouth daily. 09/04/17  Yes Oswaldo Conroy, CNM  ranitidine (ZANTAC) 150 MG tablet Take 1 tablet by mouth daily as needed.   Yes [provider]  valACYclovir (VALTREX) 1000 MG tablet Take 1 tablet by mouth daily as needed. 10/01/16  Yes [provider]  Physical Exam Vitals:  Vitals:   11/12/17 1439  BP: 110/80  Pulse: 60   Patient's last menstrual period was 10/30/2017.  General: NAD HEENT: normocephalic, anicteric Pulmonary: No increased work of breathing Neurologic: Grossly intact Psychiatric: mood appropriate, affect full  Assessment: 32 y.o. [id number] with adenomyosis and AUB here for medication follow-up.  Plan: Problem List Items Addressed This Visit      Genitourinary   Abnormal uterine bleeding     Other   Adenomyosis - Primary     We discussed that the  dosing of norethindrone is much higher than what is used in OCPs, and while safe and effective, it has a higher chance of causing side effects. We discussed other options such as Mirena, Nexplanon, DepoProvera, and trying a different OCP formulation. She does not desire any implanted devices or shots. She decided to continue on norethindrone for now, and may consider another trial with a different OCP later this year. She will call to let me know if she desires this.    We also discussed surgical management of adenomyosis/AUB including NovaSure ablation and hysterectomy. She is not ready to pursue any surgical options at this time. She is aware to schedule a visit with an MD here to discuss these options in greater depth as needed. Written literature about ablation and hysterectomy was given today.  Follow up for annual exam or sooner if symptoms worsen.  A total of 15 minutes were spent in face-to-face contact with the patient during this encounter with over half of that time devoted to counseling and coordination of care.  Marcelyn BruinsJacelyn Schmid, CNM 11/13/2017  10:59 AM

## 2017-11-22 ENCOUNTER — Ambulatory Visit
Admission: EM | Admit: 2017-11-22 | Discharge: 2017-11-22 | Disposition: A | Discharge status: Home / Self Care | Payer: BLUE CROSS/BLUE SHIELD | Attending: Family Medicine | Admitting: Family Medicine

## 2017-11-22 DIAGNOSIS — M791 Myalgia, unspecified site: Secondary | ICD-10-CM

## 2017-11-22 DIAGNOSIS — J029 Acute pharyngitis, unspecified: Secondary | ICD-10-CM | POA: Diagnosis not present

## 2017-11-22 DIAGNOSIS — R69 Illness, unspecified: Secondary | ICD-10-CM | POA: Diagnosis not present

## 2017-11-22 DIAGNOSIS — J111 Influenza due to unidentified influenza virus with other respiratory manifestations: Secondary | ICD-10-CM

## 2017-11-22 DIAGNOSIS — R6883 Chills (without fever): Secondary | ICD-10-CM

## 2017-11-22 LAB — RAPID STREP SCREEN (MED CTR MEBANE ONLY): Streptococcus, Group A Screen (Direct): NEGATIVE

## 2017-11-22 MED ORDER — OSELTAMIVIR PHOSPHATE 75 MG PO CAPS: 75.0000 mg | ORAL_CAPSULE | Freq: Two times a day (BID) | ORAL | 0 refills | Status: DC

## 2017-11-22 NOTE — ED Triage Notes (Signed)
Pt said since yesterday she has been having a sore throat, dry cough, feeling fatigue, chills and said she does have a coworker out with the flu. Did not take her temp. But said she is having hot spells. Has been taking adviil

## 2017-11-22 NOTE — ED Provider Notes (Signed)
[facility] ____________________________________________  Time seen: Approximately 600 PM  I have reviewed the triage vital signs and the nursing notes.   HISTORY  Chief Complaint Cough   HPI Maria Mcneil is a 32 y.o. female presenting for evaluation of runny nose, nasal congestion, chills, body and slight sore throat is been present since last night.  Reports direct sick contacts at work with those with influenza.  Reports generalized chills and body aches.  Continues to drink fluids well, slight decrease in appetite.  Denies other aggravating or alleviating factors. States has felt like she had a fever, has not measured temperature. Has not really taken some over-the-counter Tylenol and ibuprofen.  Denies recent sickness.  Denies renal insufficiency.  Denies pregnancy.  Denies chest pain, shortness of breath, abdominal pain, dysuria,  or rash. Denies recent sickness. Denies recent antibiotic use.    Past Medical History:  Diagnosis Date  . Family history of ovarian cancer 06/2014   PGM  - genetic testing done; pat aunt  . GERD (gastroesophageal reflux disease)   . History of Papanicolaou smear of cervix 01/07/2012; 04/29/15   neg; neg  . Pre-eclampsia   . Pregnancy induced hypertension     Patient Active Problem List   Diagnosis Date Noted  . Adenomyosis 11/13/2017  . Abnormal uterine bleeding 11/13/2017  . Previous cesarean delivery, delivered 02/24/2015  . Clinical depression 02/24/2015  . Headache, migraine 03/04/2014  . 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 CESAREAN SECTION  2008  . CESAREAN SECTION WITH BILATERAL TUBAL LIGATION N/A 02/24/2015   Procedure: CESAREAN SECTION WITH BILATERAL TUBAL LIGATION;  Surgeon: Nadara Mustardobert P Harris, MD;  Location: [facility] ORS;  Service: Obstetrics;  Laterality: N/A;     No current facility-administered medications for this  encounter.   Current Outpatient Medications:  .  norethindrone (AYGESTIN) 5 MG tablet, Take 1 tablet (5 mg total) by mouth daily., Disp: 30 tablet, Rfl: 11 .  ranitidine (ZANTAC) 150 MG tablet, Take 1 tablet by mouth daily as needed., Disp: , Rfl:  .  valACYclovir (VALTREX) 1000 MG tablet, Take 1 tablet by mouth daily as needed., Disp: , Rfl:  .  ibuprofen (ADVIL,MOTRIN) 600 MG tablet, Take 600 mg by mouth., Disp: , Rfl:  .  oseltamivir (TAMIFLU) 75 MG capsule, Take 1 capsule (75 mg total) by mouth every 12 (twelve) hours., Disp: 10 capsule, Rfl: 0  Allergies Patient has no known allergies.  Family History  Problem Relation Age of Onset  . Lung cancer Maternal Grandfather   . Ovarian cancer Paternal Grandmother [id number]  . Lung cancer Paternal Grandfather     Social History Social History   Tobacco Use  . Smoking status: Never Smoker  . Smokeless tobacco: Never Used  Substance Use Topics  . Alcohol use: No    Alcohol/week: 0.0 oz  . Drug use: No    Review of Systems Constitutional: As above Eyes: No visual changes. ENT: Positive sore throat. Cardiovascular: Denies chest pain. Respiratory: Denies shortness of breath. Gastrointestinal: No abdominal pain. Genitourinary: Negative for dysuria. Musculoskeletal: Negative for back pain. Skin: Negative for rash.   ____________________________________________   PHYSICAL EXAM:  VITAL SIGNS: ED Triage Vitals  Enc Vitals Group     BP 11/22/17 1728 114/82     Pulse Rate 11/22/17 1728 80     Resp 11/22/17 1728 18     Temp 11/22/17 1728 98.4 F (36.9 C)  Temp Source 11/22/17 1728 Oral     SpO2 11/22/17 1728 98 %     Weight --      Height --      Head Circumference --      Peak Flow --      Pain Score 11/22/17 1730 0     Pain Loc --      Pain Edu? --      Excl. in GC? --     Constitutional: Alert and oriented. Well appearing and in no acute distress. Eyes: Conjunctivae are normal.  Head: Atraumatic. No sinus  tenderness to palpation. No swelling. No erythema.  Ears: no erythema, normal TMs bilaterally.   Nose:Nasal congestion with clear rhinorrhea  Mouth/Throat: Mucous membranes are moist. Mild pharyngeal erythema. No tonsillar swelling or exudate.  Neck: No stridor.  No cervical spine tenderness to palpation. Hematological/Lymphatic/Immunilogical: No cervical lymphadenopathy. Cardiovascular: Normal rate, regular rhythm. Grossly normal heart sounds.  Good peripheral circulation. Respiratory: Normal respiratory effort.  No retractions. No wheezes, rales or rhonchi. Good air movement.  Musculoskeletal: Ambulatory with steady gait. No cervical, thoracic or lumbar tenderness to palpation. Neurologic:  Normal speech and language. No gait instability. Skin:  Skin appears warm, dry and intact. No rash noted. Psychiatric: Mood and affect are normal. Speech and behavior are normal.  ___________________________________________   LABS (all labs ordered are listed, but only abnormal results are displayed)  Labs Reviewed  RAPID STREP SCREEN (NOT AT [facility])  CULTURE, GROUP A STREP [facility])     PROCEDURES Procedures    INITIAL IMPRESSION / ASSESSMENT AND PLAN / ED COURSE  Pertinent labs & imaging results that were available during my care of the patient were reviewed by me and considered in my medical decision making (see chart for details).  Well-appearing patient.  No acute distress.  Quick strep negative, will culture.  Suspect influenza-like illness.  Will treat with Tamiflu, Rx given.  Encouraged rest, fluids, supportive care.Discussed indication, risks and benefits of medications with patient.  Discussed follow up with Primary care physician this week. Discussed follow up and return parameters including no resolution or any worsening concerns. Patient verbalized understanding and agreed to plan.   ____________________________________________   FINAL CLINICAL IMPRESSION(S) / ED  DIAGNOSES  Final diagnoses:  Influenza-like illness     ED Discharge Orders        Ordered    oseltamivir (TAMIFLU) 75 MG capsule  Every 12 hours     11/22/17 1804       Note: This dictation was prepared with Dragon dictation along with smaller phrase technology. Any transcriptional errors that result from this process are unintentional.         Renford Dills, NP 11/22/17 [phone]

## 2017-11-22 NOTE — Discharge Instructions (Signed)
Take medication as prescribed. Rest. Drink plenty of fluids.  ° °Follow up with your primary care physician this week as needed. Return to Urgent care for new or worsening concerns.  ° °

## 2017-11-25 LAB — CULTURE, GROUP A STREP (THRC)

## 2018-03-04 ENCOUNTER — Ambulatory Visit
Admission: EM | Admit: 2018-03-04 | Discharge: 2018-03-04 | Disposition: A | Discharge status: Home / Self Care | Payer: BLUE CROSS/BLUE SHIELD | Attending: Family Medicine | Admitting: Family Medicine

## 2018-03-04 ENCOUNTER — Other Ambulatory Visit: Payer: Self-pay

## 2018-03-04 DIAGNOSIS — R2233 Localized swelling, mass and lump, upper limb, bilateral: Secondary | ICD-10-CM

## 2018-03-04 DIAGNOSIS — R600 Localized edema: Secondary | ICD-10-CM

## 2018-03-04 DIAGNOSIS — R2243 Localized swelling, mass and lump, lower limb, bilateral: Secondary | ICD-10-CM | POA: Diagnosis not present

## 2018-03-04 DIAGNOSIS — R609 Edema, unspecified: Secondary | ICD-10-CM | POA: Diagnosis not present

## 2018-03-04 LAB — COMPREHENSIVE METABOLIC PANEL
ALT: 12 U/L — ABNORMAL LOW (ref 14–54)
AST: 17 U/L (ref 15–41)
Albumin: 3.7 g/dL (ref 3.5–5.0)
Alkaline Phosphatase: 50 U/L (ref 38–126)
Anion gap: 7 (ref 5–15)
BUN: 14 mg/dL (ref 6–20)
CO2: 25 mmol/L (ref 22–32)
Calcium: 8.6 mg/dL — ABNORMAL LOW (ref 8.9–10.3)
Chloride: 104 mmol/L (ref 101–111)
Creatinine, Ser: 0.77 mg/dL (ref 0.44–1.00)
GFR calc Af Amer: >60 mL/min (ref >60)
GFR calc non Af Amer: >60 mL/min (ref >60)
Glucose, Bld: 105 mg/dL — ABNORMAL HIGH (ref 65–99)
Potassium: 3.8 mmol/L (ref 3.5–5.1)
Sodium: 136 mmol/L (ref 135–145)
Total Bilirubin: 0.4 mg/dL (ref 0.3–1.2)
Total Protein: 7 g/dL (ref 6.5–8.1)

## 2018-03-04 LAB — URINALYSIS, COMPLETE (UACMP) WITH MICROSCOPIC
Bacteria, UA: NONE SEEN
Bilirubin Urine: NEGATIVE
Glucose, UA: NEGATIVE mg/dL
Hgb urine dipstick: NEGATIVE
Ketones, ur: NEGATIVE mg/dL
Leukocytes, UA: NEGATIVE
Nitrite: NEGATIVE
Protein, ur: NEGATIVE mg/dL
RBC / HPF: NONE SEEN RBC/hpf (ref 0–5)
Specific Gravity, Urine: 1.01 (ref 1.005–1.030)
WBC, UA: NONE SEEN WBC/hpf (ref 0–5)
pH: 6.5 (ref 5.0–8.0)

## 2018-03-04 LAB — CBC WITH DIFFERENTIAL/PLATELET
Basophils Absolute: 0 10*3/uL (ref 0–0.1)
Basophils Relative: 1 %
Eosinophils Absolute: 0.1 10*3/uL (ref 0–0.7)
Eosinophils Relative: 1 %
HCT: 38.1 % (ref 35.0–47.0)
Hemoglobin: 12.9 g/dL (ref 12.0–16.0)
Lymphocytes Relative: 31 %
Lymphs Abs: 1.9 10*3/uL (ref 1.0–3.6)
MCH: 29.4 pg (ref 26.0–34.0)
MCHC: 33.9 g/dL (ref 32.0–36.0)
MCV: 86.8 fL (ref 80.0–100.0)
Monocytes Absolute: 0.5 10*3/uL (ref 0.2–0.9)
Monocytes Relative: 8 %
Neutro Abs: 3.7 10*3/uL (ref 1.4–6.5)
Neutrophils Relative %: 59 %
Platelets: 209 10*3/uL (ref 150–440)
RBC: 4.39 MIL/uL (ref 3.80–5.20)
RDW: 12.4 % (ref 11.5–14.5)
WBC: 6.1 10*3/uL (ref 3.6–11.0)

## 2018-03-04 MED ORDER — NAPROXEN 500 MG PO TABS: 500.0000 mg | ORAL_TABLET | Freq: Two times a day (BID) | ORAL | 0 refills | Status: DC

## 2018-03-04 NOTE — ED Provider Notes (Signed)
[facility]    CSN: [id number] Arrival date & time: 03/04/18  1547     History   Chief Complaint Chief Complaint  Patient presents with  . Leg Swelling    HPI Maria Mcneil is a 32 y.o. female.   HPI  32 year old female presents with bilateral leg swelling more on the left as well has her hands.  States that started yesterday.  Is also noticed she has a headache and nausea and some sweating today.  Night sweats for well over several months.  Is that her hands are actually better since she quit work and came in to be seen.  History of adenomyosis valve prolapse clinical depression.  Wedding on Saturday with a lot of standing and Sunday started feeling poorly as above.  Yesterday at work she noticed the swelling of her hands and feet and noticed it more so today.  Denies any orthopnea or PND.Denies  Shortness of breath.  She has not taken any long trips in an airplane or by car recently.        Past Medical History:  Diagnosis Date  . Family history of ovarian cancer 06/2014   PGM  - genetic testing done; pat aunt  . GERD (gastroesophageal reflux disease)   . History of Papanicolaou smear of cervix 01/07/2012; 04/29/15   neg; neg  . Pre-eclampsia   . Pregnancy induced hypertension     Patient Active Problem List   Diagnosis Date Noted  . Adenomyosis 11/13/2017  . Abnormal uterine bleeding 11/13/2017  . Previous cesarean delivery, delivered 02/24/2015  . Clinical depression 02/24/2015  . Headache, migraine 03/04/2014  . 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 CESAREAN SECTION  2008  . CESAREAN SECTION WITH BILATERAL TUBAL LIGATION N/A 02/24/2015   Procedure: CESAREAN SECTION WITH BILATERAL TUBAL LIGATION;  Surgeon: Nadara Mustard, MD;  Location: [facility] ORS;  Service: Obstetrics;  Laterality: N/A;    OB History    Gravida  2   Para  2   Term  2   Preterm      AB      Living  2     SAB      TAB      Ectopic      Multiple  0   Live Births  2            Home Medications    Prior to Admission medications   Medication Sig Start Date End Date Taking? Authorizing Provider  ibuprofen (ADVIL,MOTRIN) 600 MG tablet Take 600 mg by mouth. 02/26/15  Yes [provider]  norethindrone (AYGESTIN) 5 MG tablet Take 1 tablet (5 mg total) by mouth daily. 09/04/17  Yes Oswaldo Conroy, CNM  ranitidine (ZANTAC) 150 MG tablet Take 1 tablet by mouth daily as needed.   Yes [provider]  valACYclovir (VALTREX) 1000 MG tablet Take 1 tablet by mouth daily as needed. 10/01/16  Yes [provider]  naproxen (NAPROSYN) 500 MG tablet Take 1 tablet (500 mg total) by mouth 2 (two) times daily with a meal. Take for headache 03/04/18   Lutricia Feil, PA-C    Family History Family History  Problem Relation Age of Onset  . Lung cancer Maternal Grandfather   . Ovarian cancer Paternal Grandmother 106  . Lung cancer Paternal Grandfather   . Rheum arthritis Mother   . Brain cancer Mother   .  Hypertension Mother   . Hypertension Father   . Hyperlipidemia Father     Social History Social History   Tobacco Use  . Smoking status: Never Smoker  . Smokeless tobacco: Never Used  Substance Use Topics  . Alcohol use: No    Alcohol/week: 0.0 oz  . Drug use: No     Allergies   Patient has no known allergies.   Review of Systems Review of Systems  Constitutional: Positive for activity change. Negative for chills, fatigue and fever.  Cardiovascular: Positive for leg swelling.  All other systems reviewed and are negative.    Physical Exam Triage Vital Signs ED Triage Vitals  Enc Vitals Group     BP 03/04/18 1604 122/76     Pulse Rate 03/04/18 1604 88     Resp 03/04/18 1604 17     Temp 03/04/18 1604 98.5 F (36.9 C)     Temp Source 03/04/18 1604 Oral     SpO2 03/04/18 1604 99 %     Weight 03/04/18 1602 130 lb (59  kg)     Height 03/04/18 1602  (1.575 m)     Head Circumference --      Peak Flow --      Pain Score 03/04/18 1602 3     Pain Loc --      Pain Edu? --      Excl. in GC? --    No data found.  Updated Vital Signs BP 122/76 (BP Location: Right Arm)   Pulse 88   Temp 98.5 F (36.9 C) (Oral)   Resp 17   Ht  (1.575 m)   Wt 130 lb (59 kg)   LMP 02/26/2018   SpO2 99%   BMI 23.78 kg/m   Visual Acuity Right Eye Distance:   Left Eye Distance:   Bilateral Distance:    Right Eye Near:   Left Eye Near:    Bilateral Near:     Physical Exam  Constitutional: She is oriented to person, place, and time. She appears well-developed and well-nourished. No distress.  HENT:  Head: Normocephalic.  Right Ear: External ear normal.  Left Ear: External ear normal.  Nose: Nose normal.  Mouth/Throat: Oropharynx is clear and moist. No oropharyngeal exudate.  Eyes: Pupils are equal, round, and reactive to light. Right eye exhibits no discharge. Left eye exhibits no discharge.  Neck: Normal range of motion.  Pulmonary/Chest: Effort normal and breath sounds normal.  Musculoskeletal: Normal range of motion. She exhibits no edema or tenderness.  Emanation of both lower extremities shows no pitting edema.  Maximum circumference of the calf is 39 cm bilaterally.  Patient the patient's hand shows normal contours.  There is no obvious edema present.  Normal sweating pattern of the hand and feet.  Peripheral pulses are intact and strong.  Neurological: She is alert and oriented to person, place, and time.  Skin: Skin is warm and dry. She is not diaphoretic.  Psychiatric: She has a normal mood and affect. Her behavior is normal. Judgment and thought content normal.  Nursing note and vitals reviewed.    UC Treatments / Results  Labs (all labs ordered are listed, but only abnormal results are displayed) Labs Reviewed  COMPREHENSIVE METABOLIC PANEL - Abnormal; Notable for the following  components:      Result Value   Glucose, Bld 105 (*)    Calcium 8.6 (*)    ALT 12 (*)    All other components within normal limits  CBC WITH DIFFERENTIAL/PLATELET  URINALYSIS, COMPLETE (UACMP) WITH MICROSCOPIC    EKG None  Radiology No results found.  Procedures Procedures (including critical care time)  Medications Ordered in UC Medications - No data to display  Initial Impression / Assessment and Plan / UC Course  I have reviewed the triage vital signs and the nursing notes.  Pertinent labs & imaging results that were available during my care of the patient were reviewed by me and considered in my medical decision making (see chart for details).     Plan: 1. Test/x-ray results and diagnosis reviewed with patient 2. rx as per orders; risks, benefits, potential side effects reviewed with patient 3. Recommend supportive treatment with medium compression hose at work elevation at home to keep her legs from swelling.  Monitor your salt intake.  Labs today are reassuring as is your physical exam.  Recommend following up with Dr. Frederica Kuster on Thursday as per your prearranged appointment 4. F/u prn if symptoms worsen or don't improve  Final Clinical Impressions(s) / UC Diagnoses   Final diagnoses:  Peripheral edema     Discharge Instructions     Monitor the salt in your diet.  Elevate your legs frequently to help control swelling.  Consider using compression hose at work of medium compression , knee high only.    ED Prescriptions    Medication Sig Dispense Auth. Provider   naproxen (NAPROSYN) 500 MG tablet Take 1 tablet (500 mg total) by mouth 2 (two) times daily with a meal. Take for headache 60 tablet Lutricia Feil, PA-C     Controlled Substance Prescriptions [place] Controlled Substance Registry consulted? Not Applicable   Lutricia Feil, PA-C 03/04/18 1732

## 2018-03-04 NOTE — Discharge Instructions (Addendum)
Monitor the salt in your diet.  Elevate your legs frequently to help control swelling.  Consider using compression hose at work of medium compression , knee high only.

## 2018-03-04 NOTE — ED Triage Notes (Signed)
Patient complains of bilateral leg swelling more on the left side. Patient also has noticed the swelling in her hands. Patient states that this started yesterday. Patient states that she has noticed a headache, nausea and sweating today.

## 2018-07-06 ENCOUNTER — Other Ambulatory Visit: Payer: Self-pay | Admitting: Maternal Newborn

## 2018-07-06 DIAGNOSIS — N809 Endometriosis, unspecified: Principal | ICD-10-CM

## 2018-07-06 DIAGNOSIS — N8003 Adenomyosis of the uterus: Secondary | ICD-10-CM

## 2018-07-07 ENCOUNTER — Other Ambulatory Visit: Payer: Self-pay

## 2018-08-15 ENCOUNTER — Other Ambulatory Visit
Admission: RE | Admit: 2018-08-15 | Discharge: 2018-08-15 | Disposition: A | Discharge status: Home / Self Care | Payer: BLUE CROSS/BLUE SHIELD | Source: Ambulatory Visit | Attending: Internal Medicine | Admitting: Internal Medicine

## 2018-08-15 DIAGNOSIS — R079 Chest pain, unspecified: Secondary | ICD-10-CM | POA: Insufficient documentation

## 2018-08-15 DIAGNOSIS — M7989 Other specified soft tissue disorders: Secondary | ICD-10-CM | POA: Diagnosis present

## 2018-08-15 LAB — FIBRIN DERIVATIVES D-DIMER (ARMC ONLY): Fibrin derivatives D-dimer (ARMC): 170.78 ng/mL (FEU) (ref 0.00–499.00)

## 2018-10-13 ENCOUNTER — Other Ambulatory Visit: Payer: Self-pay

## 2018-10-13 ENCOUNTER — Ambulatory Visit
Admission: EM | Admit: 2018-10-13 | Discharge: 2018-10-13 | Disposition: A | Discharge status: Home / Self Care | Payer: BLUE CROSS/BLUE SHIELD | Attending: Family Medicine | Admitting: Family Medicine

## 2018-10-13 DIAGNOSIS — J029 Acute pharyngitis, unspecified: Secondary | ICD-10-CM | POA: Insufficient documentation

## 2018-10-13 DIAGNOSIS — J0100 Acute maxillary sinusitis, unspecified: Secondary | ICD-10-CM | POA: Insufficient documentation

## 2018-10-13 LAB — RAPID STREP SCREEN (MED CTR MEBANE ONLY): Streptococcus, Group A Screen (Direct): NEGATIVE

## 2018-10-13 MED ORDER — FLUCONAZOLE 150 MG PO TABS: 150.0000 mg | ORAL_TABLET | ORAL | 0 refills | Status: AC

## 2018-10-13 MED ORDER — AMOXICILLIN-POT CLAVULANATE 875-125 MG PO TABS: 1.0000 | ORAL_TABLET | Freq: Two times a day (BID) | ORAL | 0 refills | Status: AC

## 2018-10-13 NOTE — Discharge Instructions (Signed)
SINUSITIS: Reviewed use of a nasal saline irrigation system. May consider use of intranasal steroids, such as Flonase as well. Use medications as directed. If antibiotics are prescribed, take the full course of antibiotics. Also consider use of Sudafed or Mucinex D. Increase rest, fluids. If you do not improve or if you worsen after a course of antibiotics, you should be re-examined. You may need a different antibiotic or further evaluation with imaging or an exam of the inside of the sinuses   SORE THROAT: The treatment of sore throat depends upon the cause; strep throat is treated with an antibiotic, while viral pharyngitis is treated with rest, pain relievers. If prescribed, finish the entire course of antibiotics .Increase rest, fluids, and OTC meds as needed . If you have any questions or concerns, please call us or stop back at any time and we will be happy to help you. If your symptoms worsen, f/u with our office or go to the ER

## 2018-10-13 NOTE — ED Triage Notes (Signed)
Patient complains of sinus pain and pressure, nasal congestion x 10 days. Patient states that yesterday she woke up with sore throat and pain.

## 2018-10-13 NOTE — ED Provider Notes (Signed)
[facility]    CSN: [id number] Arrival date & time: 10/13/18  1625     History   Chief Complaint Chief Complaint  Patient presents with  . Sore Throat    APPT    HPI Maria Mcneil is a 32 y.o. female. Patient presents for 10 day history of nasal congestion, sinus pressure/pain, and dry cough. She says that she developed a sore throat yesterday. Denies fever. Admits to fatigue. She has taken OTC medications without much relief. States she believes she is getting worse. She has no other concerns today.  HPI  Past Medical History:  Diagnosis Date  . Family history of ovarian cancer 06/2014   PGM  - genetic testing done; pat aunt  . GERD (gastroesophageal reflux disease)   . History of Papanicolaou smear of cervix 01/07/2012; 04/29/15   neg; neg  . Pre-eclampsia   . Pregnancy induced hypertension     Patient Active Problem List   Diagnosis Date Noted  . Adenomyosis 11/13/2017  . Abnormal uterine bleeding 11/13/2017  . Previous cesarean delivery, delivered 02/24/2015  . Clinical depression 02/24/2015  . Headache, migraine 03/04/2014  . 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 CESAREAN SECTION  2008  . CESAREAN SECTION WITH BILATERAL TUBAL LIGATION N/A 02/24/2015   Procedure: CESAREAN SECTION WITH BILATERAL TUBAL LIGATION;  Surgeon: Nadara Mustardobert P Harris, MD;  Location: [facility] ORS;  Service: Obstetrics;  Laterality: N/A;    OB History    Gravida  2   Para  2   Term  2   Preterm      AB      Living  2     SAB      TAB      Ectopic      Multiple  0   Live Births  2            Home Medications    Prior to Admission medications   Medication Sig Start Date End Date Taking? Authorizing Provider  ibuprofen (ADVIL,MOTRIN) 600 MG tablet Take 600 mg by mouth. 02/26/15  Yes [provider]  norethindrone (AYGESTIN) 5 MG tablet TAKE 1 TABLET BY MOUTH  EVERY DAY 07/09/18  Yes Oswaldo ConroySchmid, Jacelyn Y, CNM  ranitidine (ZANTAC) 150 MG tablet Take 1 tablet by mouth daily as needed.   Yes [provider]  valACYclovir (VALTREX) 1000 MG tablet Take 1 tablet by mouth daily as needed. 10/01/16  Yes [provider]  amoxicillin-clavulanate (AUGMENTIN) 875-125 MG tablet Take 1 tablet by mouth 2 (two) times daily for 10 days. 10/13/18 10/23/18  Eusebio FriendlyEaves, Lesley B, PA-C  naproxen (NAPROSYN) 500 MG tablet Take 1 tablet (500 mg total) by mouth 2 (two) times daily with a meal. Take for headache 03/04/18   Lutricia Feiloemer, William P, PA-C    Family History Family History  Problem Relation Age of Onset  . Lung cancer Maternal Grandfather   . Ovarian cancer Paternal Grandmother [phone]  . Lung cancer Paternal Grandfather   . Rheum arthritis Mother   . Brain cancer Mother   . Hypertension Mother   . Hypertension Father   . Hyperlipidemia Father     Social History Social History   Tobacco Use  . Smoking status: Never Smoker  . Smokeless tobacco: Never Used  Substance Use Topics  . Alcohol use: No    Alcohol/week: 0.0 standard drinks  . Drug use: No  Allergies   Patient has no known allergies.   Review of Systems Review of Systems  Constitutional: Positive for fatigue. Negative for chills and fever.  HENT: Positive for congestion, postnasal drip, rhinorrhea, sinus pressure, sinus pain and sore throat. Negative for ear pain.   Respiratory: Positive for cough. Negative for shortness of breath.   Cardiovascular: Negative for chest pain.  Gastrointestinal: Negative for abdominal pain, nausea and vomiting.  Musculoskeletal: Negative for arthralgias and myalgias.  Skin: Negative for color change and rash.  Neurological: Positive for weakness and headaches. Negative for dizziness.  Hematological: Negative for adenopathy.     Physical Exam Triage Vital Signs ED Triage Vitals  Enc Vitals Group     BP 10/13/18 1642 116/80     Pulse Rate 10/13/18  1642 80     Resp 10/13/18 1642 18     Temp 10/13/18 1642 98 F (36.7 C)     Temp Source 10/13/18 1642 Oral     SpO2 10/13/18 1642 99 %     Weight 10/13/18 1640 140 lb (63.5 kg)     Height 10/13/18 1640 5\' 1"  (1.549 m)     Head Circumference --      Peak Flow --      Pain Score 10/13/18 1639 3     Pain Loc --      Pain Edu? --      Excl. in GC? --    No data found.  Updated Vital Signs BP 116/80 (BP Location: Left Arm)   Pulse 80   Temp 98 F (36.7 C) (Oral)   Resp 18   Ht 5\' 1"  (1.549 m)   Wt 140 lb (63.5 kg)   LMP 10/08/2018   SpO2 99%   BMI 26.45 kg/m       Physical Exam Vitals signs and nursing note reviewed.  Constitutional:      General: She is not in acute distress.    Appearance: She is well-developed and normal weight. She is not ill-appearing or toxic-appearing.  HENT:     Head: Normocephalic and atraumatic.     Right Ear: Tympanic membrane and ear canal normal.     Left Ear: Tympanic membrane and ear canal normal. Left ear erythematous TM: moderate purulent      Nose: Mucosal edema, congestion and rhinorrhea present.     Right Sinus: Maxillary sinus tenderness present.     Left Sinus: Maxillary sinus tenderness present.     Mouth/Throat:     Mouth: Mucous membranes are moist.     Pharynx: Oropharyngeal exudate and posterior oropharyngeal erythema present. No pharyngeal swelling or uvula swelling.     Tonsils: No tonsillar exudate or tonsillar abscesses. Swelling: 1+ on the right. 1+ on the left.  Eyes:     Conjunctiva/sclera: Conjunctivae normal.  Neck:     Musculoskeletal: Neck supple.  Cardiovascular:     Rate and Rhythm: Normal rate and regular rhythm.     Heart sounds: Normal heart sounds. No murmur.  Pulmonary:     Effort: Pulmonary effort is normal. No respiratory distress.     Breath sounds: Normal breath sounds. No wheezing, rhonchi or rales.  Lymphadenopathy:     Cervical: Cervical adenopathy present.  Skin:    General: Skin is warm.      Findings: No erythema or rash.  Neurological:     General: No focal deficit present.     Mental Status: She is alert and oriented to person, place, and time.  Psychiatric:  Mood and Affect: Mood normal.        Behavior: Behavior normal.      UC Treatments / Results  Labs (all labs ordered are listed, but only abnormal results are displayed) Labs Reviewed  RAPID STREP SCREEN ([facility] ONLY)  CULTURE, GROUP A STREP [facility])    EKG None  Radiology No results found.  Procedures Procedures (including critical care time)  Medications Ordered in UC Medications - No data to display  Initial Impression / Assessment and Plan / UC Course  I have reviewed the triage vital signs and the nursing notes.  Pertinent labs & imaging results that were available during my care of the patient were reviewed by me and considered in my medical decision making (see chart for details).      Final Clinical Impressions(s) / UC Diagnoses   Final diagnoses:  Acute maxillary sinusitis, recurrence not specified  Pharyngitis, unspecified etiology     Discharge Instructions     SINUSITIS: Reviewed use of a nasal saline irrigation system. May consider use of intranasal steroids, such as Flonase as well. Use medications as directed. If antibiotics are prescribed, take the full course of antibiotics. Also consider use of Sudafed or Mucinex D. Increase rest, fluids. If you do not improve or if you worsen after a course of antibiotics, you should be re-examined. You may need a different antibiotic or further evaluation with imaging or an exam of the inside of the sinuses   SORE THROAT: The treatment of sore throat depends upon the cause; strep throat is treated with an antibiotic, while viral pharyngitis is treated with rest, pain relievers. If prescribed, finish the entire course of antibiotics .Increase rest, fluids, and OTC meds as needed . If you have any questions or concerns, please call  us or stop back at any time and we will be happy to help you. If your symptoms worsen, f/u with our office or go to the ER     ED Prescriptions    Medication Sig Dispense Auth. Provider   amoxicillin-clavulanate (AUGMENTIN) 875-125 MG tablet Take 1 tablet by mouth 2 (two) times daily for 10 days. 20 tablet Shirlee LatchEaves, Lesley B, PA-C     Controlled Substance Prescriptions [place] Controlled Substance Registry consulted? Not Applicable   Gareth Morganaves, Lesley B, PA-C 10/15/18 1958

## 2018-10-16 LAB — CULTURE, GROUP A STREP (THRC)

## 2018-10-24 ENCOUNTER — Ambulatory Visit: Payer: BLUE CROSS/BLUE SHIELD | Admitting: Maternal Newborn

## 2018-11-07 ENCOUNTER — Ambulatory Visit: Payer: BLUE CROSS/BLUE SHIELD | Admitting: Maternal Newborn

## 2019-08-20 ENCOUNTER — Telehealth: Payer: Self-pay

## 2019-08-20 NOTE — Telephone Encounter (Signed)
Pt needs to be seen for uti. Please schedule

## 2019-08-20 NOTE — Telephone Encounter (Signed)
No openging in schedule patient states she will try to go to Urgent Care. Patient is schedule for her yearly exam 09/30/19 with [facility] for annual

## 2019-09-29 NOTE — Progress Notes (Deleted)
PCP:  Ezequiel Kayser, MD   No chief complaint on file.    HPI:      [name] is a 33 y.o. [phone] who LMP was No LMP recorded., presents today for her annual examination.  Her menses are {norm/abn:715}, lasting {number:22536} days.  Dysmenorrhea {dysmen:716}. She {does:18564} have intermenstrual bleeding. Hx of possible adenomyosis with heavy bleeding, on aygestin.  Sex activity: {sex active:315163}.  Last Pap: August 09, 2017  Results were: no abnormalities /neg HPV DNA  Hx of STDs: {STD hx:14358}  There is no FH of breast cancer. There is no FH of ovarian cancer. The patient {does:18564} do self-breast exams.  Tobacco use: {tob:20664} Alcohol use: {Alcohol:11675} No drug use.  Exercise: {exercise:31265}  She {does:18564} get adequate calcium and Vitamin D in her diet.   Patient Active Problem List   Diagnosis Date Noted  . Adenomyosis 11/13/2017  . Abnormal uterine bleeding 11/13/2017  . Previous cesarean delivery, delivered 02/24/2015  . Clinical depression 02/24/2015  . Headache, migraine 03/04/2014  . 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 CESAREAN SECTION  2008  . CESAREAN SECTION WITH BILATERAL TUBAL LIGATION N/A 02/24/2015   Procedure: CESAREAN SECTION WITH BILATERAL TUBAL LIGATION;  Surgeon: Gae Dry, MD;  Location: [facility] ORS;  Service: Obstetrics;  Laterality: N/A;    Family History  Problem Relation Age of Onset  . Lung cancer Maternal Grandfather   . Ovarian cancer Paternal Grandmother 48  . Lung cancer Paternal Grandfather   . Rheum arthritis Mother   . Brain cancer Mother   . Hypertension Mother   . Hypertension Father   . Hyperlipidemia Father     Social History   Socioeconomic History  . Marital status: Married    Spouse name: Not on file  . Number of children: 2  . Years of education: Not on file  . Highest education level: Not on  file  Occupational History  . Not on file  Tobacco Use  . Smoking status: Never Smoker  . Smokeless tobacco: Never Used  Substance and Sexual Activity  . Alcohol use: No    Alcohol/week: 0.0 standard drinks  . Drug use: No  . Sexual activity: Yes    Partners: Male    Birth control/protection: None, Surgical    Comment: tubal ligation  Other Topics Concern  . Not on file  Social History Narrative  . Not on file   Social Determinants of Health   Financial Resource Strain:   . Difficulty of Paying Living Expenses: Not on file  Food Insecurity:   . Worried About Charity fundraiser in the Last Year: Not on file  . Ran Out of Food in the Last Year: Not on file  Transportation Needs:   . Lack of Transportation (Medical): Not on file  . Lack of Transportation (Non-Medical): Not on file  Physical Activity:   . Days of Exercise per Week: Not on file  . Minutes of Exercise per Session: Not on file  Stress:   . Feeling of Stress : Not on file  Social Connections:   . Frequency of Communication with Friends and Family: Not on file  . Frequency of Social Gatherings with Friends and Family: Not on file  . Attends Religious Services: Not on file  . Active Member of Clubs or Organizations: Not on file  . Attends Archivist Meetings: Not on file  .  Marital Status: Not on file  Intimate Partner Violence:   . Fear of Current or Ex-Partner: Not on file  . Emotionally Abused: Not on file  . Physically Abused: Not on file  . Sexually Abused: Not on file     Current Outpatient Medications:  .  ibuprofen (ADVIL,MOTRIN) 600 MG tablet, Take 600 mg by mouth., Disp: , Rfl:  .  naproxen (NAPROSYN) 500 MG tablet, Take 1 tablet (500 mg total) by mouth 2 (two) times daily with a meal. Take for headache, Disp: 60 tablet, Rfl: 0 .  norethindrone (AYGESTIN) 5 MG tablet, TAKE 1 TABLET BY MOUTH EVERY DAY, Disp: 90 tablet, Rfl: 3 .  ranitidine (ZANTAC) 150 MG tablet, Take 1 tablet by mouth  daily as needed., Disp: , Rfl:  .  valACYclovir (VALTREX) 1000 MG tablet, Take 1 tablet by mouth daily as needed., Disp: , Rfl:      ROS:  Review of Systems BREAST: No symptoms   Objective: There were no vitals taken for this visit.   OBGyn Exam  Results: No results found for this or any previous visit (from the past 24 hour(s)).  Assessment/Plan: No diagnosis found.  No orders of the defined types were placed in this encounter.            GYN counsel {counseling:16159}     F/U  No follow-ups on file.  Keigo Whalley B. Aashika Carta, PA-C 09/29/2019 4:53 PM

## 2019-09-30 ENCOUNTER — Ambulatory Visit: Payer: BLUE CROSS/BLUE SHIELD | Admitting: Obstetrics and Gynecology

## 2019-11-03 NOTE — Progress Notes (Deleted)
PCP:  Ezequiel Kayser, MD   No chief complaint on file.    HPI:      [name] is a 34 y.o. [phone] who LMP was No LMP recorded., presents today for her annual examination.  Her menses are {norm/abn:715}, lasting {number:22536} days.  Dysmenorrhea {dysmen:716}. She {does:18564} have intermenstrual bleeding. Hx of possible adenomyosis on u/s 2018. On aygestin. Didn't do well with Lo Lo. Had prog only side effects in past  Sex activity: {sex active:315163}.  Last Pap: August 09, 2017  Results were: no abnormalities /neg HPV DNA  Hx of STDs: {STD hx:14358}  There is no FH of breast cancer. There is no FH of ovarian cancer. The patient {does:18564} do self-breast exams.  Tobacco use: {tob:20664} Alcohol use: {Alcohol:11675} No drug use.  Exercise: {exercise:31265}  She {does:18564} get adequate calcium and Vitamin D in her diet.   Patient Active Problem List   Diagnosis Date Noted  . Adenomyosis 11/13/2017  . Abnormal uterine bleeding 11/13/2017  . Previous cesarean delivery, delivered 02/24/2015  . Clinical depression 02/24/2015  . Headache, migraine 03/04/2014  . 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 CESAREAN SECTION  2008  . CESAREAN SECTION WITH BILATERAL TUBAL LIGATION N/A 02/24/2015   Procedure: CESAREAN SECTION WITH BILATERAL TUBAL LIGATION;  Surgeon: Gae Dry, MD;  Location: [facility] ORS;  Service: Obstetrics;  Laterality: N/A;    Family History  Problem Relation Age of Onset  . Lung cancer Maternal Grandfather   . Ovarian cancer Paternal Grandmother 63  . Lung cancer Paternal Grandfather   . Rheum arthritis Mother   . Brain cancer Mother   . Hypertension Mother   . Hypertension Father   . Hyperlipidemia Father     Social History   Socioeconomic History  . Marital status: Married    Spouse name: Not on file  . Number of children: 2  . Years of  education: Not on file  . Highest education level: Not on file  Occupational History  . Not on file  Tobacco Use  . Smoking status: Never Smoker  . Smokeless tobacco: Never Used  Substance and Sexual Activity  . Alcohol use: No    Alcohol/week: 0.0 standard drinks  . Drug use: No  . Sexual activity: Yes    Partners: Male    Birth control/protection: None, Surgical    Comment: tubal ligation  Other Topics Concern  . Not on file  Social History Narrative  . Not on file   Social Determinants of Health   Financial Resource Strain:   . Difficulty of Paying Living Expenses: Not on file  Food Insecurity:   . Worried About Charity fundraiser in the Last Year: Not on file  . Ran Out of Food in the Last Year: Not on file  Transportation Needs:   . Lack of Transportation (Medical): Not on file  . Lack of Transportation (Non-Medical): Not on file  Physical Activity:   . Days of Exercise per Week: Not on file  . Minutes of Exercise per Session: Not on file  Stress:   . Feeling of Stress : Not on file  Social Connections:   . Frequency of Communication with Friends and Family: Not on file  . Frequency of Social Gatherings with Friends and Family: Not on file  . Attends Religious Services: Not on file  . Active Member of Clubs or Organizations:  Not on file  . Attends Banker Meetings: Not on file  . Marital Status: Not on file  Intimate Partner Violence:   . Fear of Current or Ex-Partner: Not on file  . Emotionally Abused: Not on file  . Physically Abused: Not on file  . Sexually Abused: Not on file     Current Outpatient Medications:  .  ibuprofen (ADVIL,MOTRIN) 600 MG tablet, Take 600 mg by mouth., Disp: , Rfl:  .  naproxen (NAPROSYN) 500 MG tablet, Take 1 tablet (500 mg total) by mouth 2 (two) times daily with a meal. Take for headache, Disp: 60 tablet, Rfl: 0 .  norethindrone (AYGESTIN) 5 MG tablet, TAKE 1 TABLET BY MOUTH EVERY DAY, Disp: 90 tablet, Rfl: 3 .   ranitidine (ZANTAC) 150 MG tablet, Take 1 tablet by mouth daily as needed., Disp: , Rfl:  .  valACYclovir (VALTREX) 1000 MG tablet, Take 1 tablet by mouth daily as needed., Disp: , Rfl:      ROS:  Review of Systems BREAST: No symptoms   Objective: There were no vitals taken for this visit.   OBGyn Exam  Results: No results found for this or any previous visit (from the past 24 hour(s)).  Assessment/Plan: No diagnosis found.  No orders of the defined types were placed in this encounter.            GYN counsel {counseling:16159}     F/U  No follow-ups on file.  Anthonyjames Bargar B. Shanyce Daris, PA-C 11/03/2019 4:27 PM

## 2019-11-04 ENCOUNTER — Ambulatory Visit: Payer: BC Managed Care – PPO | Admitting: Obstetrics and Gynecology

## 2019-11-24 NOTE — Progress Notes (Deleted)
PCP:  Ezequiel Kayser, MD   No chief complaint on file.    HPI:      Maria Mcneil is a 34 y.o. [phone] who LMP was No LMP recorded., presents today for her annual examination.  Her menses are {norm/abn:715}, lasting {number:22536} days.  Dysmenorrhea {dysmen:716}. She {does:18564} have intermenstrual bleeding. Hx of possible adenomyosis on u/s 2018. On aygestin. Didn't do well with Lo Lo. Had prog only side effects in past  Sex activity: {sex active:315163}.  Last Pap: August 09, 2017  Results were: no abnormalities /neg HPV DNA  Hx of STDs: {STD hx:14358}  There is no FH of breast cancer. There is no FH of ovarian cancer. The patient {does:18564} do self-breast exams.  Tobacco use: {tob:20664} Alcohol use: {Alcohol:11675} No drug use.  Exercise: {exercise:31265}  She {does:18564} get adequate calcium and Vitamin D in her diet.   Patient Active Problem List   Diagnosis Date Noted  . Adenomyosis 11/13/2017  . Abnormal uterine bleeding 11/13/2017  . Previous cesarean delivery, delivered 02/24/2015  . Clinical depression 02/24/2015  . Headache, migraine 03/04/2014  . 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 CESAREAN SECTION  2008  . CESAREAN SECTION WITH BILATERAL TUBAL LIGATION N/A 02/24/2015   Procedure: CESAREAN SECTION WITH BILATERAL TUBAL LIGATION;  Surgeon: Gae Dry, MD;  Location: [facility] ORS;  Service: Obstetrics;  Laterality: N/A;    Family History  Problem Relation Age of Onset  . Lung cancer Maternal Grandfather   . Ovarian cancer Paternal Grandmother 62  . Lung cancer Paternal Grandfather   . Rheum arthritis Mother   . Brain cancer Mother   . Hypertension Mother   . Hypertension Father   . Hyperlipidemia Father     Social History   Socioeconomic History  . Marital status: Married    Spouse name: Not on file  . Number of children: 2  . Years of  education: Not on file  . Highest education level: Not on file  Occupational History  . Not on file  Tobacco Use  . Smoking status: Never Smoker  . Smokeless tobacco: Never Used  Substance and Sexual Activity  . Alcohol use: No    Alcohol/week: 0.0 standard drinks  . Drug use: No  . Sexual activity: Yes    Partners: Male    Birth control/protection: None, Surgical    Comment: tubal ligation  Other Topics Concern  . Not on file  Social History Narrative  . Not on file   Social Determinants of Health   Financial Resource Strain:   . Difficulty of Paying Living Expenses: Not on file  Food Insecurity:   . Worried About Charity fundraiser in the Last Year: Not on file  . Ran Out of Food in the Last Year: Not on file  Transportation Needs:   . Lack of Transportation (Medical): Not on file  . Lack of Transportation (Non-Medical): Not on file  Physical Activity:   . Days of Exercise per Week: Not on file  . Minutes of Exercise per Session: Not on file  Stress:   . Feeling of Stress : Not on file  Social Connections:   . Frequency of Communication with Friends and Family: Not on file  . Frequency of Social Gatherings with Friends and Family: Not on file  . Attends Religious Services: Not on file  . Active Member of Clubs or Organizations:  Not on file  . Attends Banker Meetings: Not on file  . Marital Status: Not on file  Intimate Partner Violence:   . Fear of Current or Ex-Partner: Not on file  . Emotionally Abused: Not on file  . Physically Abused: Not on file  . Sexually Abused: Not on file     Current Outpatient Medications:  .  ibuprofen (ADVIL,MOTRIN) 600 MG tablet, Take 600 mg by mouth., Disp: , Rfl:  .  naproxen (NAPROSYN) 500 MG tablet, Take 1 tablet (500 mg total) by mouth 2 (two) times daily with a meal. Take for headache, Disp: 60 tablet, Rfl: 0 .  norethindrone (AYGESTIN) 5 MG tablet, TAKE 1 TABLET BY MOUTH EVERY DAY, Disp: 90 tablet, Rfl: 3 .   ranitidine (ZANTAC) 150 MG tablet, Take 1 tablet by mouth daily as needed., Disp: , Rfl:  .  valACYclovir (VALTREX) 1000 MG tablet, Take 1 tablet by mouth daily as needed., Disp: , Rfl:      ROS:  Review of Systems BREAST: No symptoms   Objective: There were no vitals taken for this visit.   OBGyn Exam  Results: No results found for this or any previous visit (from the past 24 hour(s)).  Assessment/Plan: No diagnosis found.  No orders of the defined types were placed in this encounter.            GYN counsel {counseling:16159}     F/U  No follow-ups on file.  Maria Hankey B. Jamecia Lerman, PA-C 11/24/2019 4:53 PM

## 2019-11-25 ENCOUNTER — Ambulatory Visit: Payer: BC Managed Care – PPO | Admitting: Obstetrics and Gynecology

## 2020-01-29 DIAGNOSIS — R1013 Epigastric pain: Secondary | ICD-10-CM | POA: Insufficient documentation

## 2020-01-29 DIAGNOSIS — F5104 Psychophysiologic insomnia: Secondary | ICD-10-CM | POA: Insufficient documentation

## 2020-02-18 NOTE — Progress Notes (Signed)
PCP:  Mickey Farber, MD   Chief Complaint  Patient presents with  . Gynecologic Exam     HPI:      [name] is a 34 y.o. [id number] whose LMP was Patient's last menstrual period was 02/05/2020 (approximate)., presents today for her annual examination.  Her menses are regular every 28-30 days, lasting 6-7 days, heavy flow with clots. Sometimes has 3 days light spotting after 7 day periods. Sometimes then has 2 days light bleeding mid-cycle.  Dysmenorrhea moderate, improved with NSAIDs. Hx of AUB, pelvic pain; diagnosed with adenomyosis in past. Did Lo Loestrin with constant bleeding. Then treated with aygestin. Did well with cycle control but stopped it. Declines pills, did IUD in past and doesn't want another one. Had normal CBC with PCP 2/21., Takes MVI with iron.  Sex activity: single partner, contraception - tubal ligation.  Last Pap: 08/09/17  Results were: no abnormalities /neg HPV DNA . Decreased libido from last yr has improved with Wellbutrin use.  There is no FH of breast cancer. There is no FH of ovarian cancer (FH clarified to hx of cervical cancer in PGM and pat aunt). The patient does do self-breast exams.  Tobacco use: The patient denies current or previous tobacco use. Alcohol use: social drinker No drug use.  Exercise: moderately active  She does get adequate calcium but not Vitamin D in her diet. Labs with PCP.  Past Medical History:  Diagnosis Date  . GERD (gastroesophageal reflux disease)   . History of Papanicolaou smear of cervix 01/07/2012; 04/29/15   neg; neg  . Pre-eclampsia   . Pregnancy induced hypertension     Past Surgical History:  Procedure Laterality Date  . CESAREAN SECTION  2008  . CESAREAN SECTION WITH BILATERAL TUBAL LIGATION N/A 02/24/2015   Procedure: CESAREAN SECTION WITH BILATERAL TUBAL LIGATION;  Surgeon: Nadara Mustard, MD;  Location: [facility] ORS;  Service: Obstetrics;  Laterality: N/A;    Family History  Problem Relation Age  of Onset  . Cervical cancer Paternal Grandmother 49  . Lung cancer Paternal Grandfather   . Rheum arthritis Mother   . Brain cancer Mother   . Hypertension Mother   . Hypertension Father   . Hyperlipidemia Father   . Cervical cancer Paternal Aunt        24s, has contact    Social History   Socioeconomic History  . Marital status: Married    Spouse name: Not on file  . Number of children: 2  . Years of education: Not on file  . Highest education level: Not on file  Occupational History  . Not on file  Tobacco Use  . Smoking status: Never Smoker  . Smokeless tobacco: Never Used  Substance and Sexual Activity  . Alcohol use: No    Alcohol/week: 0.0 standard drinks  . Drug use: No  . Sexual activity: Yes    Partners: Male    Birth control/protection: Surgical    Comment: tubal ligation  Other Topics Concern  . Not on file  Social History Narrative  . Not on file   Social Determinants of Health   Financial Resource Strain:   . Difficulty of Paying Living Expenses:   Food Insecurity:   . Worried About Programme researcher, broadcasting/film/video in the Last Year:   . Barista in the Last Year:   Transportation Needs:   . Freight forwarder (Medical):   Marland Kitchen Lack of Transportation (Non-Medical):   Physical Activity:   .  Days of Exercise per Week:   . Minutes of Exercise per Session:   Stress:   . Feeling of Stress :   Social Connections:   . Frequency of Communication with Friends and Family:   . Frequency of Social Gatherings with Friends and Family:   . Attends Religious Services:   . Active Member of Clubs or Organizations:   . Attends Banker Meetings:   Marland Kitchen Marital Status:   Intimate Partner Violence:   . Fear of Current or Ex-Partner:   . Emotionally Abused:   Marland Kitchen Physically Abused:   . Sexually Abused:      Current Outpatient Medications:  .  Biotin 10 MG TABS, Take by mouth., Disp: , Rfl:  .  buPROPion (WELLBUTRIN XL) 150 MG 24 hr tablet, Take by mouth.,  Disp: , Rfl:  .  diphenhydrAMINE (BENADRYL) 25 mg capsule, Take by mouth., Disp: , Rfl:  .  ibuprofen (ADVIL) 600 MG tablet, Take by mouth., Disp: , Rfl:  .  loratadine (CLARITIN) 10 MG tablet, Take by mouth., Disp: , Rfl:  .  Multiple Vitamin (MULTI-VITAMIN) tablet, Take by mouth., Disp: , Rfl:  .  naproxen (NAPROSYN) 500 MG tablet, Take 1 tablet (500 mg total) by mouth 2 (two) times daily with a meal. Take for headache, Disp: 60 tablet, Rfl: 0 .  ranitidine (ZANTAC) 150 MG tablet, Take 1 tablet by mouth daily as needed., Disp: , Rfl:  .  valACYclovir (VALTREX) 1000 MG tablet, Take 1 tablet by mouth daily as needed., Disp: , Rfl:      ROS:  Review of Systems  Constitutional: Negative for fatigue, fever and unexpected weight change.  Respiratory: Negative for cough, shortness of breath and wheezing.   Cardiovascular: Negative for chest pain, palpitations and leg swelling.  Gastrointestinal: Negative for blood in stool, constipation, diarrhea, nausea and vomiting.  Endocrine: Negative for cold intolerance, heat intolerance and polyuria.  Genitourinary: Negative for dyspareunia, dysuria, flank pain, frequency, genital sores, hematuria, menstrual problem, pelvic pain, urgency, vaginal bleeding, vaginal discharge and vaginal pain.  Musculoskeletal: Negative for back pain, joint swelling and myalgias.  Skin: Negative for rash.  Neurological: Negative for dizziness, syncope, light-headedness, numbness and headaches.  Hematological: Negative for adenopathy.  Psychiatric/Behavioral: Negative for agitation, confusion, sleep disturbance and suicidal ideas. The patient is not nervous/anxious.    BREAST: No symptoms   Objective: Ht 5\' 2"  (1.575 m)   Wt 155 lb (70.3 kg)   LMP 02/05/2020 (Approximate)   BMI 28.35 kg/m    Physical Exam Constitutional:      Appearance: She is well-developed.  Genitourinary:     Vulva, vagina, cervix, uterus, right adnexa and left adnexa normal.     No  vulval lesion or tenderness noted.     No vaginal discharge, erythema or tenderness.     No cervical polyp.     Uterus is not enlarged or tender.     No right or left adnexal mass present.     Right adnexa not tender.     Left adnexa not tender.  Neck:     Thyroid: No thyromegaly.  Cardiovascular:     Rate and Rhythm: Normal rate and regular rhythm.     Heart sounds: Normal heart sounds. No murmur.  Pulmonary:     Effort: Pulmonary effort is normal.     Breath sounds: Normal breath sounds.  Chest:     Breasts:        Right: No mass, nipple discharge, skin  change or tenderness.        Left: No mass, nipple discharge, skin change or tenderness.  Abdominal:     Palpations: Abdomen is soft.     Tenderness: There is no abdominal tenderness. There is no guarding.  Musculoskeletal:        General: Normal range of motion.     Cervical back: Normal range of motion.  Neurological:     General: No focal deficit present.     Mental Status: She is alert and oriented to person, place, and time.     Cranial Nerves: No cranial nerve deficit.  Skin:    General: Skin is warm and dry.  Psychiatric:        Mood and Affect: Mood normal.        Behavior: Behavior normal.        Thought Content: Thought content normal.        Judgment: Judgment normal.  Vitals reviewed.     Assessment/Plan: Encounter for annual routine gynecological examination  Menometrorrhagia--discussed cycle control options of hormones, IUD, ablation, lysteda, hyst. Pt declines for now. Will f/u prn.   Adenomyosis            GYN counsel adequate intake of calcium and vitamin D, diet and exercise     F/U  Return in about 1 year (around 02/18/2021).  Jamani Bearce B. Cori Henningsen, PA-C 02/19/2020 11:30 AM

## 2020-02-19 ENCOUNTER — Other Ambulatory Visit: Payer: Self-pay

## 2020-02-19 ENCOUNTER — Encounter: Payer: Self-pay | Admitting: Obstetrics and Gynecology

## 2020-02-19 ENCOUNTER — Ambulatory Visit (INDEPENDENT_AMBULATORY_CARE_PROVIDER_SITE_OTHER): Payer: BC Managed Care – PPO | Admitting: Obstetrics and Gynecology

## 2020-02-19 VITALS — Ht 62.0 in | Wt 155.0 lb

## 2020-02-19 DIAGNOSIS — N800 Endometriosis of uterus: Secondary | ICD-10-CM | POA: Diagnosis not present

## 2020-02-19 DIAGNOSIS — Z01419 Encounter for gynecological examination (general) (routine) without abnormal findings: Secondary | ICD-10-CM

## 2020-02-19 DIAGNOSIS — N8003 Adenomyosis of the uterus: Secondary | ICD-10-CM

## 2020-02-19 DIAGNOSIS — N921 Excessive and frequent menstruation with irregular cycle: Secondary | ICD-10-CM | POA: Diagnosis not present

## 2020-02-19 NOTE — Patient Instructions (Signed)
I value your feedback and entrusting us with your care. If you get a [facility] patient survey, I would appreciate you taking the time to let us know about your experience today. Thank you!  As of September 24, 2019, your lab results will be released to your MyChart immediately, before I even have a chance to see them. Please give me time to review them and contact you if there are any abnormalities. Thank you for your patience.  

## 2020-02-29 ENCOUNTER — Encounter: Payer: Self-pay | Admitting: Obstetrics and Gynecology

## 2020-02-29 ENCOUNTER — Other Ambulatory Visit: Payer: Self-pay | Admitting: Obstetrics and Gynecology

## 2020-02-29 MED ORDER — METRONIDAZOLE 500 MG PO TABS: 500.0000 mg | ORAL_TABLET | Freq: Two times a day (BID) | ORAL | 0 refills | Status: DC

## 2020-02-29 NOTE — Progress Notes (Signed)
Rx flagyl for BV sx. F/u prn.  °

## 2020-09-23 DIAGNOSIS — M797 Fibromyalgia: Secondary | ICD-10-CM | POA: Insufficient documentation

## 2020-11-03 ENCOUNTER — Other Ambulatory Visit: Payer: Self-pay

## 2020-11-03 ENCOUNTER — Ambulatory Visit
Admission: RE | Admit: 2020-11-03 | Discharge: 2020-11-03 | Disposition: A | Discharge status: Home / Self Care | Payer: BC Managed Care – PPO | Source: Ambulatory Visit | Attending: Sports Medicine | Admitting: Sports Medicine

## 2020-11-03 VITALS — BP 122/91 | HR 88 | Temp 98.3°F | Resp 14 | Ht 61.0 in | Wt 150.0 lb

## 2020-11-03 DIAGNOSIS — R519 Headache, unspecified: Secondary | ICD-10-CM | POA: Diagnosis present

## 2020-11-03 DIAGNOSIS — R059 Cough, unspecified: Secondary | ICD-10-CM | POA: Insufficient documentation

## 2020-11-03 DIAGNOSIS — J069 Acute upper respiratory infection, unspecified: Secondary | ICD-10-CM

## 2020-11-03 DIAGNOSIS — R21 Rash and other nonspecific skin eruption: Secondary | ICD-10-CM | POA: Diagnosis not present

## 2020-11-03 DIAGNOSIS — Z791 Long term (current) use of non-steroidal anti-inflammatories (NSAID): Secondary | ICD-10-CM | POA: Diagnosis not present

## 2020-11-03 DIAGNOSIS — Z20822 Contact with and (suspected) exposure to covid-19: Secondary | ICD-10-CM | POA: Diagnosis not present

## 2020-11-03 LAB — SARS CORONAVIRUS 2 (TAT 6-24 HRS): SARS Coronavirus 2: NEGATIVE

## 2020-11-03 MED ORDER — PROMETHAZINE-DM 6.25-15 MG/5ML PO SYRP: 5.0000 mL | ORAL_SOLUTION | Freq: Four times a day (QID) | ORAL | 0 refills | Status: DC | PRN

## 2020-11-03 MED ORDER — BENZONATATE 100 MG PO CAPS: 200.0000 mg | ORAL_CAPSULE | Freq: Three times a day (TID) | ORAL | 0 refills | Status: DC

## 2020-11-03 MED ORDER — AEROCHAMBER MV MISC: 2 refills | Status: DC

## 2020-11-03 MED ORDER — ALBUTEROL SULFATE HFA 108 (90 BASE) MCG/ACT IN AERS: 2.0000 | INHALATION_SPRAY | RESPIRATORY_TRACT | 0 refills | Status: DC | PRN

## 2020-11-03 NOTE — Discharge Instructions (Addendum)
Related home until the results of your COVID test are back.  If your test comes back positive you will need to quarantine for 5 days from the onset of your symptoms.  You can break quarantine after 5 days if your symptoms have improved, you have not had a fever for 24 hours without taking Tylenol and ibuprofen, and you wear a mask around other people for an additional 5 days.  Use over-the-counter Tylenol and ibuprofen as needed for fever and pain.  Use the albuterol inhaler with a spacer, 2 puffs every 4-6 hours, as needed for chest tightness.  Use the Tessalon Perles during the day and the Promethazine DM cough syrup at bedtime.  If you develop any shortness of breath-especially at rest, you are unable to speak in full sentences, or has a late sign your lips are turning blue you need to go to the ER for evaluation.

## 2020-11-03 NOTE — ED Triage Notes (Signed)
Patient c/o headache and itchy rash on her back that started on Tuesday.  Patient also reports runny nose, cough, and fatigue that started on Wed.  Patient denies fevers.

## 2020-11-03 NOTE — ED Provider Notes (Signed)
[facility]    CSN: [id number] Arrival date & time: 11/03/20  3299      History   Chief Complaint Chief Complaint  Patient presents with  . Headache  . Rash  . Fatigue  . Cough  . Nasal Congestion    HPI Maria Mcneil is a 35 y.o. female.   HPI   35 year old female here for evaluation of itchy rash on her back, headache, runny nose, fatigue, and cough that started 2 days ago.  Patient reports that she was exposed to COVID through a friend last week.  Patient has been vaccinated but has not received her booster shot and has not received her flu shot.  Patient states that her cough started today and it is dry.  She reports feeling a tightness and burning in her chest.  She also has a runny nose for a clear discharge and is complaining of significant body aches.  Patient denies fever, shortness of breath or wheezing, GI complaints, or changes to her sense of taste or smell.  Patient does report that after she received her COVID vaccines that she developed weird rashes and swelling of her lips.  She has been evaluated by rheumatology and was found to have a positive ANA but there is no definitive diagnosis of lupus or other autoimmune disorder.  Past Medical History:  Diagnosis Date  . GERD (gastroesophageal reflux disease)   . History of Papanicolaou smear of cervix 01/07/2012; 04/29/15   neg; neg  . Pre-eclampsia   . Pregnancy induced hypertension     Patient Active Problem List   Diagnosis Date Noted  . Menometrorrhagia 02/19/2020  . Adenomyosis 11/13/2017  . Abnormal uterine bleeding 11/13/2017  . Previous cesarean delivery, delivered 02/24/2015  . Clinical depression 02/24/2015  . Headache, migraine 03/04/2014  . 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 CESAREAN SECTION  2008  . CESAREAN SECTION WITH BILATERAL TUBAL LIGATION N/A 02/24/2015   Procedure: CESAREAN  SECTION WITH BILATERAL TUBAL LIGATION;  Surgeon: Nadara Mustard, MD;  Location: [facility] ORS;  Service: Obstetrics;  Laterality: N/A;    OB History    Gravida  2   Para  2   Term  2   Preterm      AB      Living  2     SAB      IAB      Ectopic      Multiple  0   Live Births  2            Home Medications    Prior to Admission medications   Medication Sig Start Date End Date Taking? Authorizing Provider  albuterol (VENTOLIN HFA) 108 (90 Base) MCG/ACT inhaler Inhale 2 puffs into the lungs every 4 (four) hours as needed. 11/03/20  Yes Becky Augusta, NP  benzonatate (TESSALON) 100 MG capsule Take 2 capsules (200 mg total) by mouth every 8 (eight) hours. 11/03/20  Yes Becky Augusta, NP  Biotin 10 MG TABS Take by mouth.   Yes [provider]  diphenhydrAMINE (BENADRYL) 25 mg capsule Take by mouth.   Yes [provider]  loratadine (CLARITIN) 10 MG tablet Take by mouth.   Yes [provider]  Multiple Vitamin (MULTI-VITAMIN) tablet Take by mouth.   Yes [provider]  promethazine-dextromethorphan (PROMETHAZINE-DM) 6.25-15 MG/5ML syrup Take 5 mLs by mouth 4 (four) times daily as needed. 11/03/20  Yes Becky Augusta, NP  ranitidine (ZANTAC) 150 MG tablet Take 1 tablet by mouth daily as needed.   Yes [provider]  Spacer/Aero-Holding Chambers (AEROCHAMBER MV) inhaler Use as instructed 11/03/20  Yes Becky Augusta, NP  ibuprofen (ADVIL) 600 MG tablet Take by mouth. 02/26/15   [provider]  naproxen (NAPROSYN) 500 MG tablet Take 1 tablet (500 mg total) by mouth 2 (two) times daily with a meal. Take for headache 03/04/18   Lutricia Feil, PA-C  valACYclovir (VALTREX) 1000 MG tablet Take 1 tablet by mouth daily as needed. 10/01/16   [provider]  buPROPion (WELLBUTRIN XL) 150 MG 24 hr tablet Take by mouth. 12/16/19 11/03/20  [provider]    Family History Family History  Problem Relation Age of Onset  .  Cervical cancer Paternal Grandmother 51  . Lung cancer Paternal Grandfather   . Rheum arthritis Mother   . Brain cancer Mother   . Hypertension Mother   . Hypertension Father   . Hyperlipidemia Father   . Cervical cancer Paternal Aunt        72s, has contact    Social History Social History   Tobacco Use  . Smoking status: Never Smoker  . Smokeless tobacco: Never Used  Vaping Use  . Vaping Use: Never used  Substance Use Topics  . Alcohol use: No    Alcohol/week: 0.0 standard drinks  . Drug use: No     Allergies   Patient has no known allergies.   Review of Systems Review of Systems  Constitutional: Positive for fatigue. Negative for activity change and fever.  HENT: Positive for rhinorrhea.   Gastrointestinal: Negative for diarrhea, nausea and vomiting.  Musculoskeletal: Positive for arthralgias and myalgias.  Skin: Positive for rash.  Neurological: Positive for headaches.  Hematological: Negative.   Psychiatric/Behavioral: Negative.      Physical Exam Triage Vital Signs ED Triage Vitals  Enc Vitals Group     BP 11/03/20 0955 (!) 122/91     Pulse Rate 11/03/20 0955 88     Resp 11/03/20 0955 14     Temp 11/03/20 0955 98.3 F (36.8 C)     Temp Source 11/03/20 0955 Oral     SpO2 11/03/20 0955 99 %     Weight 11/03/20 0951 150 lb (68 kg)     Height 11/03/20 0951 5\' 1"  (1.549 m)     Head Circumference --      Peak Flow --      Pain Score 11/03/20 0951 5     Pain Loc --      Pain Edu? --      Excl. in GC? --    No data found.  Updated Vital Signs BP (!) 122/91 (BP Location: Left Arm)   Pulse 88   Temp 98.3 F (36.8 C) (Oral)   Resp 14   Ht 5\' 1"  (1.549 m)   Wt 150 lb (68 kg)   LMP 10/13/2020 (Approximate)   SpO2 99%   BMI 28.34 kg/m   Visual Acuity Right Eye Distance:   Left Eye Distance:   Bilateral Distance:    Right Eye Near:   Left Eye Near:    Bilateral Near:     Physical Exam Vitals and nursing note reviewed.  Constitutional:       General: She is not in acute distress.    Appearance: Normal appearance. She is not toxic-appearing.  HENT:     Head: Normocephalic.  Right Ear: Tympanic membrane, ear canal and external ear normal.     Left Ear: Tympanic membrane, ear canal and external ear normal.     Nose: Congestion and rhinorrhea present.     Comments: Nasal mucosa is mildly erythematous and edematous with scant clear nasal discharge.    Mouth/Throat:     Mouth: Mucous membranes are moist.     Pharynx: Oropharynx is clear. No oropharyngeal exudate or posterior oropharyngeal erythema.  Cardiovascular:     Rate and Rhythm: Normal rate and regular rhythm.     Pulses: Normal pulses.     Heart sounds: Normal heart sounds. No murmur heard. No gallop.   Pulmonary:     Effort: Pulmonary effort is normal.     Breath sounds: Normal breath sounds. No wheezing, rhonchi or rales.  Musculoskeletal:     Cervical back: Normal range of motion and neck supple.  Lymphadenopathy:     Cervical: No cervical adenopathy.  Skin:    General: Skin is warm and dry.     Capillary Refill: Capillary refill takes less than 2 seconds.     Findings: No erythema or rash.  Neurological:     General: No focal deficit present.     Mental Status: She is alert.  Psychiatric:        Mood and Affect: Mood normal.        Behavior: Behavior normal.        Thought Content: Thought content normal.        Judgment: Judgment normal.      UC Treatments / Results  Labs (all labs ordered are listed, but only abnormal results are displayed) Labs Reviewed  SARS CORONAVIRUS 2 (TAT 6-24 HRS)    EKG   Radiology No results found.  Procedures Procedures (including critical care time)  Medications Ordered in UC Medications - No data to display  Initial Impression / Assessment and Plan / UC Course  I have reviewed the triage vital signs and the nursing notes.  Pertinent labs & imaging results that were available during my care of the  patient were reviewed by me and considered in my medical decision making (see chart for details).   Duration of multiple symptoms that could possibly be COVID.  Patient reports that her symptoms started with an itchy rash on her back the rash are scattered erythematous macular papular lesions without vesicles on both sides of the spine and on the left anterior portion of the neck.  Nasal mucosa is also mildly erythematous and edematous with clear nasal discharge.  Posterior oropharynx is clear.  No cervical lymphadenopathy appreciated.  Lungs clear to auscultation all fields.  Patient has been swab for COVID but has not received her booster shot and she did have a COVID exposure a week ago.  Will swab patient for COVID and have her isolate at home.  We will give her an inhaler of albuterol with a spacer to help with her chest tightness, Tessalon Perles and Promethazine DM for cough and congestion, and topical triamcinolone for her itchy rash.   Final Clinical Impressions(s) / UC Diagnoses   Final diagnoses:  Viral URI with cough     Discharge Instructions     Related home until the results of your COVID test are back.  If your test comes back positive you will need to quarantine for 5 days from the onset of your symptoms.  You can break quarantine after 5 days if your symptoms have improved, you have not  had a fever for 24 hours without taking Tylenol and ibuprofen, and you wear a mask around other people for an additional 5 days.  Use over-the-counter Tylenol and ibuprofen as needed for fever and pain.  Use the albuterol inhaler with a spacer, 2 puffs every 4-6 hours, as needed for chest tightness.  Use the Tessalon Perles during the day and the Promethazine DM cough syrup at bedtime.  If you develop any shortness of breath-especially at rest, you are unable to speak in full sentences, or has a late sign your lips are turning blue you need to go to the ER for evaluation.    ED  Prescriptions    Medication Sig Dispense Auth. Provider   albuterol (VENTOLIN HFA) 108 (90 Base) MCG/ACT inhaler Inhale 2 puffs into the lungs every 4 (four) hours as needed. 18 g Becky Augusta, NP   Spacer/Aero-Holding Chambers (AEROCHAMBER MV) inhaler Use as instructed 1 each Becky Augusta, NP   benzonatate (TESSALON) 100 MG capsule Take 2 capsules (200 mg total) by mouth every 8 (eight) hours. 21 capsule Becky Augusta, NP   promethazine-dextromethorphan (PROMETHAZINE-DM) 6.25-15 MG/5ML syrup Take 5 mLs by mouth 4 (four) times daily as needed. 118 mL Becky Augusta, NP     PDMP not reviewed this encounter.   Becky Augusta, NP 11/03/20 1110

## 2021-02-16 ENCOUNTER — Ambulatory Visit
Admission: EM | Admit: 2021-02-16 | Discharge: 2021-02-16 | Disposition: A | Discharge status: Home / Self Care | Payer: BC Managed Care – PPO | Attending: Sports Medicine | Admitting: Sports Medicine

## 2021-02-16 ENCOUNTER — Other Ambulatory Visit: Payer: Self-pay

## 2021-02-16 ENCOUNTER — Ambulatory Visit: Admit: 2021-02-16 | Payer: BC Managed Care – PPO

## 2021-02-16 DIAGNOSIS — R059 Cough, unspecified: Secondary | ICD-10-CM | POA: Insufficient documentation

## 2021-02-16 DIAGNOSIS — M791 Myalgia, unspecified site: Secondary | ICD-10-CM

## 2021-02-16 DIAGNOSIS — Z20822 Contact with and (suspected) exposure to covid-19: Secondary | ICD-10-CM | POA: Insufficient documentation

## 2021-02-16 DIAGNOSIS — R6883 Chills (without fever): Secondary | ICD-10-CM | POA: Diagnosis not present

## 2021-02-16 DIAGNOSIS — J069 Acute upper respiratory infection, unspecified: Secondary | ICD-10-CM | POA: Insufficient documentation

## 2021-02-16 DIAGNOSIS — Z8616 Personal history of COVID-19: Secondary | ICD-10-CM | POA: Diagnosis not present

## 2021-02-16 NOTE — Discharge Instructions (Addendum)
As we discussed, your COVID test is pending. For your cough I recommend Delsym or Robitussin.  You said you have some Tessalon Perles at home. Educational handouts provided. Plenty of rest, plenty fluids, Tylenol or Motrin for any fever or discomfort. If your symptoms persist please contact your primary care physician's office.  If they worsen please go to the ER.

## 2021-02-16 NOTE — ED Provider Notes (Signed)
[facility]    CSN: [id number] Arrival date & time: 02/16/21  1536      History   Chief Complaint Chief Complaint  Patient presents with  . Cough    HPI Maria Mcneil is a 35 y.o. female.   Patient is a pleasant 35 year old female who presents for evaluation of the above issue.  She normally sees Dr. Audelia Actonheis at [facility] in [place].  He was unavailable to see her today.  She works as a Sales executivedental assistant.  She reports 3 days of cough as well as chest tightness from the cough.  This morning she developed myalgias, body aches, and chills with ear pressure.  She has been vaccinated against COVID x2 but no booster.  She did not receive her influenza vaccine.  She did have COVID several months ago in January 2022.  Although she does not report any COVID exposure she was at a conference last week in [place] and there have been several positive cases noted from that conference.  She denies any significant headache, or sore throat.  No abdominal or urinary symptoms.  No chest pain or shortness of breath.  No red flag signs or symptoms elicited on history.     Past Medical History:  Diagnosis Date  . GERD (gastroesophageal reflux disease)   . History of Papanicolaou smear of cervix 01/07/2012; 04/29/15   neg; neg  . Pre-eclampsia   . Pregnancy induced hypertension     Patient Active Problem List   Diagnosis Date Noted  . Menometrorrhagia 02/19/2020  . Adenomyosis 11/13/2017  . Abnormal uterine bleeding 11/13/2017  . Previous cesarean delivery, delivered 02/24/2015  . Clinical depression 02/24/2015  . Headache, migraine 03/04/2014  . 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 CESAREAN SECTION  2008  . CESAREAN SECTION WITH BILATERAL TUBAL LIGATION N/A 02/24/2015   Procedure: CESAREAN SECTION WITH BILATERAL TUBAL LIGATION;  Surgeon: Nadara Mustardobert P Harris, MD;  Location: [facility] ORS;   Service: Obstetrics;  Laterality: N/A;    OB History    Gravida  2   Para  2   Term  2   Preterm      AB      Living  2     SAB      IAB      Ectopic      Multiple  0   Live Births  2            Home Medications    Prior to Admission medications   Medication Sig Start Date End Date Taking? Authorizing Provider  albuterol (VENTOLIN HFA) 108 (90 Base) MCG/ACT inhaler Inhale 2 puffs into the lungs every 4 (four) hours as needed. 11/03/20   Becky Augustayan, Jeremy, NP  benzonatate (TESSALON) 100 MG capsule Take 2 capsules (200 mg total) by mouth every 8 (eight) hours. 11/03/20   Becky Augustayan, Jeremy, NP  Biotin 10 MG TABS Take by mouth.    [provider]  diphenhydrAMINE (BENADRYL) 25 mg capsule Take by mouth.    [provider]  ibuprofen (ADVIL) 600 MG tablet Take by mouth. 02/26/15   [provider]  loratadine (CLARITIN) 10 MG tablet Take by mouth.    [provider]  Multiple Vitamin (MULTI-VITAMIN) tablet Take by mouth.    [provider]  naproxen (NAPROSYN) 500 MG tablet Take 1 tablet (500 mg total) by mouth 2 (two) times daily with a  meal. Take for headache 03/04/18   Lutricia Feil, PA-C  promethazine-dextromethorphan (PROMETHAZINE-DM) 6.25-15 MG/5ML syrup Take 5 mLs by mouth 4 (four) times daily as needed. 11/03/20   Becky Augusta, NP  ranitidine (ZANTAC) 150 MG tablet Take 1 tablet by mouth daily as needed.    [provider]  Spacer/Aero-Holding Chambers (AEROCHAMBER MV) inhaler Use as instructed 11/03/20   Becky Augusta, NP  valACYclovir (VALTREX) 1000 MG tablet Take 1 tablet by mouth daily as needed. 10/01/16   [provider]  buPROPion (WELLBUTRIN XL) 150 MG 24 hr tablet Take by mouth. 12/16/19 11/03/20  [provider]    Family History Family History  Problem Relation Age of Onset  . Cervical cancer Paternal Grandmother 57  . Lung cancer Paternal Grandfather   . Rheum arthritis Mother   . Brain  cancer Mother   . Hypertension Mother   . Hypertension Father   . Hyperlipidemia Father   . Cervical cancer Paternal Aunt        3s, has contact    Social History Social History   Tobacco Use  . Smoking status: Never Smoker  . Smokeless tobacco: Never Used  Vaping Use  . Vaping Use: Never used  Substance Use Topics  . Alcohol use: No    Alcohol/week: 0.0 standard drinks  . Drug use: No     Allergies   Patient has no known allergies.   Review of Systems Review of Systems  Constitutional: Positive for chills. Negative for activity change, appetite change, diaphoresis, fatigue and fever.  HENT: Positive for ear pain. Negative for congestion, ear discharge, postnasal drip, rhinorrhea, sinus pressure, sinus pain, sneezing and sore throat.   Eyes: Negative.  Negative for pain.  Respiratory: Positive for cough and chest tightness. Negative for shortness of breath and wheezing.   Cardiovascular: Negative.  Negative for chest pain and palpitations.  Gastrointestinal: Negative.  Negative for abdominal pain, diarrhea, nausea and vomiting.  Genitourinary: Negative.  Negative for dysuria.  Musculoskeletal: Positive for myalgias. Negative for arthralgias, back pain, joint swelling, neck pain and neck stiffness.  Skin: Negative.  Negative for color change, pallor, rash and wound.  Neurological: Negative.  Negative for dizziness, syncope, light-headedness, numbness and headaches.  Hematological: Negative for adenopathy. Does not bruise/bleed easily.     Physical Exam Triage Vital Signs ED Triage Vitals  Enc Vitals Group     BP 02/16/21 1549 133/66     Pulse Rate 02/16/21 1549 78     Resp 02/16/21 1549 16     Temp 02/16/21 1549 97.8 F (36.6 C)     Temp Source 02/16/21 1549 Oral     SpO2 02/16/21 1549 100 %     Weight 02/16/21 1550 150 lb (68 kg)     Height 02/16/21 1550 5\' 1"  (1.549 m)     Head Circumference --      Peak Flow --      Pain Score 02/16/21 1550 4     Pain Loc  --      Pain Edu? --      Excl. in [place]? --    No data found.  Updated Vital Signs BP 133/66   Pulse 78   Temp 97.8 F (36.6 C) (Oral)   Resp 16   Ht 5\' 1"  (1.549 m)   Wt 68 kg   SpO2 100%   BMI 28.34 kg/m   Visual Acuity Right Eye Distance:   Left Eye Distance:   Bilateral Distance:  Right Eye Near:   Left Eye Near:    Bilateral Near:     Physical Exam Vitals and nursing note reviewed.  Constitutional:      General: She is not in acute distress.    Appearance: Normal appearance. She is not ill-appearing, toxic-appearing or diaphoretic.  HENT:     Head: Normocephalic and atraumatic.     Right Ear: Tympanic membrane and ear canal normal.     Left Ear: Tympanic membrane and ear canal normal.     Nose: Nose normal. No congestion or rhinorrhea.     Mouth/Throat:     Mouth: Mucous membranes are moist.     Pharynx: No oropharyngeal exudate or posterior oropharyngeal erythema.  Eyes:     General: No scleral icterus.       Right eye: No discharge.        Left eye: No discharge.     Extraocular Movements: Extraocular movements intact.     Conjunctiva/sclera: Conjunctivae normal.     Pupils: Pupils are equal, round, and reactive to light.  Cardiovascular:     Rate and Rhythm: Normal rate and regular rhythm.     Pulses: Normal pulses.     Heart sounds: Normal heart sounds. No murmur heard. No friction rub. No gallop.   Pulmonary:     Effort: Pulmonary effort is normal.     Breath sounds: No stridor. No wheezing, rhonchi or rales.  Musculoskeletal:     Cervical back: Normal range of motion and neck supple.  Skin:    General: Skin is warm and dry.     Capillary Refill: Capillary refill takes less than 2 seconds.     Findings: No bruising, erythema, lesion or rash.  Neurological:     General: No focal deficit present.     Mental Status: She is alert and oriented to person, place, and time.      UC Treatments / Results  Labs (all labs ordered are listed, but  only abnormal results are displayed) Labs Reviewed  SARS CORONAVIRUS 2 (TAT 6-24 HRS)    EKG   Radiology No results found.  Procedures Procedures (including critical care time)  Medications Ordered in UC Medications - No data to display  Initial Impression / Assessment and Plan / UC Course  I have reviewed the triage vital signs and the nursing notes.  Pertinent labs & imaging results that were available during my care of the patient were reviewed by me and considered in my medical decision making (see chart for details).  Clinical impression: Cough x3 days with body aches, myalgias, and chills with ear pressure that began this morning.  Clinically her exam and vital signs are reassuring.  Treatment plan: 1.  The findings and treatment plan were discussed in detail with the patient.  Patient was in agreement. 2.  We will obtain COVID testing.  It was pending at the time of discharge.  Someone will contact her if her results are positive. 3.  Educational handouts provided. 4.  Supportive care, over-the-counter meds as needed, Tylenol or Motrin for any fever or discomfort. 5.  Offered a work note she deferred. 6.  If her symptoms persist or worsen she should see her primary care provider.  If they are really bad she should go to the emergency room. 7.  She was discharged from care in stable condition and she will follow-up with [place] as needed.    Final Clinical Impressions(s) / UC Diagnoses   Final diagnoses:  Upper  respiratory tract infection, unspecified type  Cough  Myalgia  Chills     Discharge Instructions     As we discussed, your COVID test is pending. For your cough I recommend Delsym or Robitussin.  You said you have some Tessalon Perles at home. Educational handouts provided. Plenty of rest, plenty fluids, Tylenol or Motrin for any fever or discomfort. If your symptoms persist please contact your primary care physician's office.  If they worsen please go to  the ER.    ED Prescriptions    None     PDMP not reviewed this encounter.   Delton See, MD 02/18/21 [phone]

## 2021-02-16 NOTE — ED Triage Notes (Signed)
Pt reports having productive cough since Tuesday. Sts today she began to have body aches and chills. Also reports having chest discomfort from the cough.

## 2021-02-17 LAB — SARS CORONAVIRUS 2 (TAT 6-24 HRS): SARS Coronavirus 2: NEGATIVE

## 2021-06-05 NOTE — Progress Notes (Signed)
PCP:  Mickey Farber, MD (Inactive)   Chief Complaint  Patient presents with   Gynecologic Exam    Vag bleeding about all month is still happening     HPI:      [name] is a 35 y.o. [id number] whose LMP was Patient's last menstrual period was 05/22/2021., presents today for her annual examination.  Her menses are regular every 28-30 days, lasting 5 days, heavy flow with large clots, bleeding stops for 2 days, then restarts like a period for 5 days, then has brown spotting rest of the cycle. Dysmenorrhea moderate, improved with NSAIDs. Periods worsening past year; used to have occas BTB and flow wasn't as heavy. Hx of AUB, pelvic pain; diagnosed with adenomyosis in 2018. Did Lo Loestrin with constant bleeding. Then treated with aygestin QD with sx control but stopped it and did ok till this past yr.  Has also had IUD in past and doesn't want another one.  Also now with menstrual migraines for 1 wk before and during menses, takes NSAIDs; as well as breast tenderness luteal phase.   Sex activity: single partner, contraception - tubal ligation. Now with dyspareunia, 1 time with PC bleeding that triggered mid cycle bleeding.  Last Pap: 08/09/17  Results were: no abnormalities /neg HPV DNA . Decreased libido in past; had improved with Wellbutrin use.  There is no FH of breast cancer. There is no FH of ovarian cancer (FH clarified to hx of cervical cancer in PGM and pat aunt). The patient does do self-breast exams.  Tobacco use: The patient denies current or previous tobacco use. Alcohol use: none No drug use.  Exercise: min active  She does get adequate calcium and Vitamin D in her diet. Labs with PCP.  Past Medical History:  Diagnosis Date   GERD (gastroesophageal reflux disease)    History of Papanicolaou smear of cervix 01/07/2012; 04/29/15   neg; neg   Pre-eclampsia    Pregnancy induced hypertension     Past Surgical History:  Procedure Laterality Date   CESAREAN  SECTION  2008   CESAREAN SECTION WITH BILATERAL TUBAL LIGATION N/A 02/24/2015   Procedure: CESAREAN SECTION WITH BILATERAL TUBAL LIGATION;  Surgeon: Nadara Mustard, MD;  Location: [facility] ORS;  Service: Obstetrics;  Laterality: N/A;    Family History  Problem Relation Age of Onset   Cervical cancer Paternal Grandmother 65   Lung cancer Paternal Grandfather    Rheum arthritis Mother    Brain cancer Mother    Hypertension Mother    Hypertension Father    Hyperlipidemia Father    Cervical cancer Paternal Aunt        77s, has contact    Social History   Socioeconomic History   Marital status: Married    Spouse name: Not on file   Number of children: 2   Years of education: Not on file   Highest education level: Not on file  Occupational History   Not on file  Tobacco Use   Smoking status: Never   Smokeless tobacco: Never  Vaping Use   Vaping Use: Never used  Substance and Sexual Activity   Alcohol use: No    Alcohol/week: 0.0 standard drinks   Drug use: No   Sexual activity: Yes    Partners: Male    Birth control/protection: Surgical    Comment: tubal ligation  Other Topics Concern   Not on file  Social History Narrative   Not on file   Social Determinants  of Health   Financial Resource Strain: Not on file  Food Insecurity: Not on file  Transportation Needs: Not on file  Physical Activity: Not on file  Stress: Not on file  Social Connections: Not on file  Intimate Partner Violence: Not on file     Current Outpatient Medications:    diphenhydrAMINE (BENADRYL) 25 mg capsule, Take by mouth., Disp: , Rfl:    ibuprofen (ADVIL) 600 MG tablet, Take by mouth., Disp: , Rfl:    loratadine (CLARITIN) 10 MG tablet, Take by mouth., Disp: , Rfl:    Multiple Vitamin (MULTI-VITAMIN) tablet, Take by mouth., Disp: , Rfl:    ranitidine (ZANTAC) 150 MG tablet, Take 1 tablet by mouth daily as needed., Disp: , Rfl:    valACYclovir (VALTREX) 1000 MG tablet, Take 1 tablet by mouth  daily as needed., Disp: , Rfl:      ROS:  Review of Systems  Constitutional:  Positive for fatigue. Negative for fever and unexpected weight change.  Respiratory:  Negative for cough, shortness of breath and wheezing.   Cardiovascular:  Negative for chest pain, palpitations and leg swelling.  Gastrointestinal:  Negative for blood in stool, constipation, diarrhea, nausea and vomiting.  Endocrine: Negative for cold intolerance, heat intolerance and polyuria.  Genitourinary:  Positive for dyspareunia and menstrual problem. Negative for dysuria, flank pain, frequency, genital sores, hematuria, pelvic pain, urgency, vaginal bleeding, vaginal discharge and vaginal pain.  Musculoskeletal:  Positive for arthralgias. Negative for back pain, joint swelling and myalgias.  Skin:  Negative for rash.  Neurological:  Positive for headaches. Negative for dizziness, syncope, light-headedness and numbness.  Hematological:  Negative for adenopathy.  Psychiatric/Behavioral:  Negative for agitation, confusion, sleep disturbance and suicidal ideas. The patient is not nervous/anxious.   BREAST: No symptoms   Objective: BP 110/70   Ht 5\' 2"  (1.575 m)   Wt 156 lb (70.8 kg)   LMP 05/22/2021   BMI 28.53 kg/m    Physical Exam Constitutional:      Appearance: She is well-developed.  Genitourinary:     Vulva normal.     Right Labia: No rash, tenderness or lesions.    Left Labia: No tenderness, lesions or rash.    No vaginal discharge, erythema or tenderness.      Right Adnexa: not tender and no mass present.    Left Adnexa: not tender and no mass present.    No cervical friability or polyp.     Uterus is not enlarged or tender.  Breasts:    Right: No mass, nipple discharge, skin change or tenderness.     Left: No mass, nipple discharge, skin change or tenderness.  Neck:     Thyroid: No thyromegaly.  Cardiovascular:     Rate and Rhythm: Normal rate and regular rhythm.     Heart sounds: Normal  heart sounds. No murmur heard. Pulmonary:     Effort: Pulmonary effort is normal.     Breath sounds: Normal breath sounds.  Abdominal:     Palpations: Abdomen is soft.     Tenderness: There is no abdominal tenderness. There is no guarding or rebound.  Musculoskeletal:        General: Normal range of motion.     Cervical back: Normal range of motion.  Lymphadenopathy:     Cervical: No cervical adenopathy.  Neurological:     General: No focal deficit present.     Mental Status: She is alert and oriented to person, place, and time.  Cranial Nerves: No cranial nerve deficit.  Skin:    General: Skin is warm and dry.  Psychiatric:        Mood and Affect: Mood normal.        Behavior: Behavior normal.        Thought Content: Thought content normal.        Judgment: Judgment normal.  Vitals reviewed.    Assessment/Plan: Encounter for annual routine gynecological examination  Cervical cancer screening - Plan: Cytology - PAP  Screening for HPV (human papillomavirus) - Plan: Cytology - PAP  Abnormal uterine bleeding (AUB) - Plan: TSH, T4, free, CBC with Differential/Platelet, US PELVIC COMPLETE WITH TRANSVAGINAL, Cytology - PAP; with hx of adenomyosis. Given worsening sx, check labs, pap, GYN u/s. Will f/u with results. May restart aygestin QD based on results.   Menometrorrhagia - Plan: TSH, T4, free, CBC with Differential/Platelet, US PELVIC COMPLETE WITH TRANSVAGINAL  Adenomyosis - Plan: US PELVIC COMPLETE WITH TRANSVAGINAL  Screening for deficiency anemia - Plan: CBC with Differential/Platelet  Thyroid disorder screening - Plan: TSH, T4, free  GYN counsel adequate intake of calcium and vitamin D, diet and exercise     F/U  Return in about 1 year (around 06/06/2022).  Stasha Naraine B. Amaziah Ghosh, PA-C 06/06/2021 11:12 AM

## 2021-06-06 ENCOUNTER — Ambulatory Visit (INDEPENDENT_AMBULATORY_CARE_PROVIDER_SITE_OTHER): Payer: BC Managed Care – PPO | Admitting: Obstetrics and Gynecology

## 2021-06-06 ENCOUNTER — Encounter: Payer: Self-pay | Admitting: Obstetrics and Gynecology

## 2021-06-06 ENCOUNTER — Other Ambulatory Visit (HOSPITAL_COMMUNITY)
Admission: RE | Admit: 2021-06-06 | Discharge: 2021-06-06 | Disposition: A | Discharge status: Home / Self Care | Payer: BC Managed Care – PPO | Source: Ambulatory Visit | Attending: Obstetrics and Gynecology | Admitting: Obstetrics and Gynecology

## 2021-06-06 ENCOUNTER — Other Ambulatory Visit: Payer: Self-pay

## 2021-06-06 VITALS — BP 110/70 | Ht 62.0 in | Wt 156.0 lb

## 2021-06-06 DIAGNOSIS — Z124 Encounter for screening for malignant neoplasm of cervix: Secondary | ICD-10-CM | POA: Diagnosis not present

## 2021-06-06 DIAGNOSIS — Z1329 Encounter for screening for other suspected endocrine disorder: Secondary | ICD-10-CM

## 2021-06-06 DIAGNOSIS — N939 Abnormal uterine and vaginal bleeding, unspecified: Secondary | ICD-10-CM

## 2021-06-06 DIAGNOSIS — Z01419 Encounter for gynecological examination (general) (routine) without abnormal findings: Secondary | ICD-10-CM

## 2021-06-06 DIAGNOSIS — Z1151 Encounter for screening for human papillomavirus (HPV): Secondary | ICD-10-CM | POA: Insufficient documentation

## 2021-06-06 DIAGNOSIS — N921 Excessive and frequent menstruation with irregular cycle: Secondary | ICD-10-CM

## 2021-06-06 DIAGNOSIS — N8003 Adenomyosis of the uterus: Secondary | ICD-10-CM

## 2021-06-06 DIAGNOSIS — Z130 Encounter for screening for diseases of the blood and blood-forming organs and certain disorders involving the immune mechanism: Secondary | ICD-10-CM

## 2021-06-06 DIAGNOSIS — N800 Endometriosis of uterus: Secondary | ICD-10-CM

## 2021-06-07 LAB — CBC WITH DIFFERENTIAL/PLATELET
Basophils Absolute: 0 10*3/uL (ref 0.0–0.2)
Basos: 1 %
EOS (ABSOLUTE): 0 10*3/uL (ref 0.0–0.4)
Eos: 1 %
Hematocrit: 39.8 % (ref 34.0–46.6)
Hemoglobin: 12.9 g/dL (ref 11.1–15.9)
Immature Grans (Abs): 0 10*3/uL (ref 0.0–0.1)
Immature Granulocytes: 0 %
Lymphocytes Absolute: 1.2 10*3/uL (ref 0.7–3.1)
Lymphs: 26 %
MCH: 29.2 pg (ref 26.6–33.0)
MCHC: 32.4 g/dL (ref 31.5–35.7)
MCV: 90 fL (ref 79–97)
Monocytes Absolute: 0.4 10*3/uL (ref 0.1–0.9)
Monocytes: 9 %
Neutrophils Absolute: 3 10*3/uL (ref 1.4–7.0)
Neutrophils: 63 %
Platelets: 244 10*3/uL (ref 150–450)
RBC: 4.42 x10E6/uL (ref 3.77–5.28)
RDW: 12.8 % (ref 11.7–15.4)
WBC: 4.7 10*3/uL (ref 3.4–10.8)

## 2021-06-07 LAB — CYTOLOGY - PAP
Comment: NEGATIVE
Diagnosis: NEGATIVE
High risk HPV: NEGATIVE

## 2021-06-07 LAB — T4, FREE: Free T4: 1.06 ng/dL (ref 0.82–1.77)

## 2021-06-07 LAB — TSH: TSH: 1.92 u[IU]/mL (ref 0.450–4.500)

## 2021-06-21 ENCOUNTER — Ambulatory Visit: Payer: BC Managed Care – PPO

## 2021-06-29 ENCOUNTER — Ambulatory Visit: Payer: BC Managed Care – PPO

## 2021-10-10 ENCOUNTER — Encounter: Payer: Self-pay | Admitting: Obstetrics and Gynecology

## 2021-10-12 ENCOUNTER — Other Ambulatory Visit: Payer: Self-pay | Admitting: Obstetrics and Gynecology

## 2021-10-12 MED ORDER — METRONIDAZOLE 500 MG PO TABS: 500.0000 mg | ORAL_TABLET | Freq: Two times a day (BID) | ORAL | 0 refills | Status: AC

## 2021-10-12 NOTE — Progress Notes (Signed)
Rx RF flagyl for BV sx.  

## 2021-10-18 ENCOUNTER — Ambulatory Visit: Payer: BC Managed Care – PPO

## 2021-10-19 ENCOUNTER — Other Ambulatory Visit: Payer: Self-pay

## 2021-10-19 ENCOUNTER — Ambulatory Visit
Admission: RE | Admit: 2021-10-19 | Discharge: 2021-10-19 | Disposition: A | Discharge status: Home / Self Care | Payer: 59 | Source: Ambulatory Visit | Attending: Emergency Medicine | Admitting: Emergency Medicine

## 2021-10-19 VITALS — BP 116/83 | HR 96 | Temp 98.2°F | Resp 18

## 2021-10-19 DIAGNOSIS — B349 Viral infection, unspecified: Secondary | ICD-10-CM | POA: Diagnosis not present

## 2021-10-19 NOTE — ED Provider Notes (Signed)
[facility]    CSN: [id number] Arrival date & time: 10/19/21  0859      History   Chief Complaint Chief Complaint  Patient presents with   Ear Fullness   Headache   Sore Throat    HPI Maria Mcneil is a 36 y.o. female.  Patient presents with 3-day history of headache, sinus pressure, ear fullness, sore throat, cough.  Treatment at home with Tylenol cold and sinus.  She denies fever, rash, chest pain, shortness of breath, vomiting, or other symptoms.  Her mother and sister recently tested positive for COVID.  Her medical history includes migraine headache.  The history is provided by the patient and medical records.   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 Authorizing Provider  diphenhydrAMINE (BENADRYL) 25 mg capsule Take by mouth.    [provider]  ibuprofen (ADVIL) 600 MG  tablet Take by mouth. 02/26/15   [provider]  loratadine (CLARITIN) 10 MG tablet Take by mouth.    [provider]  metroNIDAZOLE (FLAGYL) 500 MG tablet Take 1 tablet (500 mg total) by mouth 2 (two) times daily for 7 days. 10/12/21 [id number]  Copland, Deirdre Evener, PA-C  Multiple Vitamin (MULTI-VITAMIN) tablet Take by mouth.    [provider]  ranitidine (ZANTAC) 150 MG tablet Take 1 tablet by mouth daily as needed.    [provider]  valACYclovir (VALTREX) 1000 MG tablet Take 1 tablet by mouth daily as needed. 10/01/16   [provider]  buPROPion (WELLBUTRIN XL) 150 MG 24 hr tablet Take by mouth. 12/16/19 11/03/20  [provider]    Family History Family History  Problem Relation Age of Onset   Cervical cancer Paternal Grandmother 34   Lung cancer Paternal Grandfather    Rheum arthritis Mother    Brain cancer Mother    Hypertension Mother    Hypertension Father    Hyperlipidemia Father    Cervical cancer Paternal Aunt        70s, has contact    Social History Social History   Tobacco Use   Smoking status: Never  Smokeless tobacco: Never  Vaping Use   Vaping Use: Never used  Substance Use Topics   Alcohol use: No    Alcohol/week: 0.0 standard drinks   Drug use: No     Allergies   Patient has no known allergies.   Review of Systems Review of Systems  Constitutional:  Negative for chills and fever.  HENT:  Positive for congestion, ear pain, rhinorrhea and sore throat.   Respiratory:  Positive for cough. Negative for shortness of breath.   Cardiovascular:  Negative for chest pain and palpitations.  Gastrointestinal:  Negative for diarrhea and vomiting.  Skin:  Negative for color change and rash.  All other systems reviewed and are negative.   Physical Exam Triage Vital Signs ED Triage Vitals  Enc Vitals Group     BP 10/19/21 0909 116/83     Pulse Rate 10/19/21 0909 96     Resp 10/19/21 0909 18     Temp  10/19/21 0909 98.2 F (36.8 C)     Temp Source 10/19/21 0909 Oral     SpO2 10/19/21 0909 98 %     Weight --      Height --      Head Circumference --      Peak Flow --      Pain Score 10/19/21 0912 7     Pain Loc --      Pain Edu? --      Excl. in [place]? --    No data found.  Updated Vital Signs BP 116/83 (BP Location: Left Arm)    Pulse 96    Temp 98.2 F (36.8 C) (Oral)    Resp 18    SpO2 98%   Visual Acuity Right Eye Distance:   Left Eye Distance:   Bilateral Distance:    Right Eye Near:   Left Eye Near:    Bilateral Near:     Physical Exam Vitals and nursing note reviewed.  Constitutional:      General: She is not in acute distress.    Appearance: She is well-developed. She is not ill-appearing.  HENT:     Right Ear: Tympanic membrane normal.     Left Ear: Tympanic membrane normal.     Nose: Rhinorrhea present.     Mouth/Throat:     Mouth: Mucous membranes are moist.     Pharynx: Oropharynx is clear.  Cardiovascular:     Rate and Rhythm: Normal rate and regular rhythm.     Heart sounds: Normal heart sounds.  Pulmonary:     Effort: Pulmonary effort is normal. No respiratory distress.     Breath sounds: Normal breath sounds.  Musculoskeletal:     Cervical back: Neck supple.  Skin:    General: Skin is warm and dry.  Neurological:     Mental Status: She is alert.  Psychiatric:        Mood and Affect: Mood normal.        Behavior: Behavior normal.     UC Treatments / Results  Labs (all labs ordered are listed, but only abnormal results are displayed) Labs Reviewed  COVID-19, FLU A+B NAA    EKG   Radiology No results found.  Procedures Procedures (including critical care time)  Medications Ordered in UC Medications - No data to display  Initial Impression / Assessment and Plan / UC Course  I have reviewed the triage vital signs and the nursing notes.  Pertinent labs & imaging results that were available during my  care of the patient were  reviewed by me and considered in my medical decision making (see chart for details).   Viral illness.  COVID and Flu pending.  Instructed patient to self quarantine per CDC guidelines.  Discussed symptomatic treatment including Tylenol or ibuprofen, rest, hydration.  Instructed patient to follow up with PCP if symptoms are not improving.  Patient agrees to plan of care.    Final Clinical Impressions(s) / UC Diagnoses   Final diagnoses:  Viral illness     Discharge Instructions      Your COVID and Flu tests are pending.  You should self quarantine until the test results are back.    Take Tylenol or ibuprofen as needed for fever or discomfort.  Rest and keep yourself hydrated.    Follow-up with your primary care provider if your symptoms are not improving.         ED Prescriptions   None    PDMP not reviewed this encounter.   Sharion Balloon, NP 10/19/21 [phone]

## 2021-10-19 NOTE — ED Triage Notes (Addendum)
Pt here with 3 days hx of severe headache with ear fullness and pressure. Today the pt also wakes up with sore throat. Negative COVID test at home. Denies fevers.

## 2021-10-19 NOTE — Discharge Instructions (Addendum)
Your COVID and Flu tests are pending.  You should self quarantine until the test results are back.    Take Tylenol or ibuprofen as needed for fever or discomfort.  Rest and keep yourself hydrated.    Follow-up with your primary care provider if your symptoms are not improving.     

## 2021-10-20 LAB — COVID-19, FLU A+B NAA
Influenza A, NAA: NOT DETECTED
Influenza B, NAA: NOT DETECTED
SARS-CoV-2, NAA: NOT DETECTED

## 2021-12-29 DIAGNOSIS — Z8261 Family history of arthritis: Secondary | ICD-10-CM | POA: Insufficient documentation

## 2022-01-01 DIAGNOSIS — H04123 Dry eye syndrome of bilateral lacrimal glands: Secondary | ICD-10-CM | POA: Insufficient documentation

## 2022-01-01 DIAGNOSIS — E66811 Obesity, class 1: Secondary | ICD-10-CM | POA: Insufficient documentation

## 2022-02-01 ENCOUNTER — Other Ambulatory Visit: Payer: Self-pay | Admitting: Physician Assistant

## 2022-02-01 DIAGNOSIS — G379 Demyelinating disease of central nervous system, unspecified: Secondary | ICD-10-CM

## 2022-02-16 ENCOUNTER — Ambulatory Visit
Admission: RE | Admit: 2022-02-16 | Discharge: 2022-02-16 | Disposition: A | Discharge status: Home / Self Care | Payer: 59 | Source: Ambulatory Visit | Attending: Physician Assistant | Admitting: Physician Assistant

## 2022-02-16 DIAGNOSIS — G379 Demyelinating disease of central nervous system, unspecified: Secondary | ICD-10-CM | POA: Insufficient documentation

## 2022-02-16 MED ORDER — GADOBUTROL 1 MMOL/ML IV SOLN
7.0000 mL | Freq: Once | INTRAVENOUS | Status: AC | PRN
  Administered 2022-02-16: 7 mL via INTRAVENOUS

## 2022-06-04 ENCOUNTER — Encounter: Payer: Self-pay | Admitting: Obstetrics & Gynecology

## 2022-06-04 ENCOUNTER — Ambulatory Visit: Payer: 59 | Admitting: Obstetrics & Gynecology

## 2022-06-04 VITALS — BP 110/70 | Ht 62.0 in | Wt 153.0 lb

## 2022-06-04 DIAGNOSIS — N644 Mastodynia: Secondary | ICD-10-CM

## 2022-06-04 NOTE — Progress Notes (Signed)
   Subjective:    Patient ID: Maria Mcneil, female    DOB: Mar 19, 1986, 36 y.o.   MRN: [id number]  HPI 36 yo married P2 (7 and 21 yo kids) here with a 4 month h/o significant right breast pain along the lower and inner edge. She also reports some weight gain (with a normal TSH) with some bilateral breast enlargement and breast itching. She uses underwire bras. She denies nipple discharge or skin changes. She uses a BTL for contraception. Recently she has been having bilateral breast tenderness just before her periods.  She denies a FH of breast cancer. She breastfed her second child for 3 months.  Review of Systems Pap normal 2022   Objective:   Physical Exam Well nourished, well hydrated White female, no apparent distress She is ambulating and conversing normally. Breast exam done in the sitting and supine positions. All normal tissue bilaterally, no evidence of disease. She did wince with exam of entire right breast.       Assessment & Plan:   Right breast pain- I believe this is related to her underwire bra. I have suggested changing to a non-underwire bra. If she does this and still has pain, she will come back. Otherwise, she will schedule an annual exam.

## 2022-08-07 ENCOUNTER — Ambulatory Visit
Admission: RE | Admit: 2022-08-07 | Discharge: 2022-08-07 | Disposition: A | Discharge status: Home / Self Care | Payer: 59 | Source: Ambulatory Visit | Attending: Urgent Care | Admitting: Urgent Care

## 2022-08-07 VITALS — BP 115/77 | HR 84 | Temp 98.1°F | Resp 18

## 2022-08-07 DIAGNOSIS — J40 Bronchitis, not specified as acute or chronic: Secondary | ICD-10-CM

## 2022-08-07 DIAGNOSIS — J329 Chronic sinusitis, unspecified: Secondary | ICD-10-CM

## 2022-08-07 MED ORDER — AZITHROMYCIN 250 MG PO TABS: ORAL_TABLET | ORAL | 0 refills | Status: DC

## 2022-08-07 MED ORDER — BENZONATATE 100 MG PO CAPS: ORAL_CAPSULE | ORAL | 0 refills | Status: DC

## 2022-08-07 NOTE — ED Triage Notes (Signed)
Pt. Presents to UC w/ c/o a cough and the feeling of burning in her chest for the past week. Pt. States she was sick two weeks ago and her symptoms never completley went away. Pt. Also states the burning from her chest is starting to spread to her back and shoulders.

## 2022-08-07 NOTE — ED Provider Notes (Addendum)
[facility]    CSN: [id number] Arrival date & time: 08/07/22  1412      History   Chief Complaint Chief Complaint  Patient presents with   Cough    Entered by patient   Chest Pain   Generalized Body Aches   Headache    HPI Maria Mcneil is a 36 y.o. female.    Cough Associated symptoms: chest pain and headaches   Chest Pain Associated symptoms: cough and headache   Headache Associated symptoms: cough     Presents to UC with complaint of cough and sensation of burning in her chest x1 week.  Patient states she was sick 2 weeks ago with URI and symptoms never resolved.  She states the burning in her chest is starting to spread to her back and shoulders.  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 Authorizing Provider  diphenhydrAMINE (BENADRYL) 25 mg capsule Take by mouth.    [provider]  ibuprofen (ADVIL) 600 MG tablet Take by mouth. 02/26/15   [provider]  loratadine (CLARITIN) 10 MG tablet Take by mouth.    [provider]  Multiple Vitamin (MULTI-VITAMIN) tablet Take by mouth.    [provider]  ranitidine (ZANTAC) 150 MG tablet Take 1 tablet by mouth daily as needed.    [provider]  valACYclovir (VALTREX) 1000 MG tablet Take 1 tablet by mouth daily as needed. 10/01/16   [provider]  buPROPion (WELLBUTRIN XL) 150 MG 24 hr tablet Take by mouth. 12/16/19 11/03/20  [provider]    Family History Family History  Problem Relation Age of Onset   Cervical cancer Paternal Grandmother 85   Lung cancer Paternal Grandfather    Rheum arthritis Mother    Brain cancer Mother    Hypertension Mother    Hypertension Father    Hyperlipidemia Father    Cervical cancer Paternal Aunt        94s, has contact    Social History Social History   Tobacco Use   Smoking status: Never   Smokeless tobacco: Never  Vaping Use  Vaping Use: Never used  Substance Use Topics   Alcohol use: No    Alcohol/week: 0.0 standard drinks of alcohol   Drug use: No     Allergies   Patient has no known allergies.   Review of Systems Review of Systems  Respiratory:  Positive for cough.   Cardiovascular:  Positive for chest pain.  Neurological:  Positive for headaches.     Physical Exam Triage Vital Signs ED Triage Vitals [08/07/22 1447]  Enc Vitals Group     BP 115/77     Pulse Rate 84     Resp 18     Temp 98.1 F (36.7 C)     Temp src      SpO2 98 %     Weight      Height      Head Circumference      Peak Flow      Pain Score 6     Pain Loc      Pain Edu?      Excl. in [place]?    No data found.  Updated Vital Signs BP 115/77   Pulse 84   Temp 98.1 F (36.7 C)   Resp 18   LMP 07/23/2022 (Approximate)   SpO2 98%   Visual Acuity Right Eye Distance:   Left Eye Distance:   Bilateral  Distance:    Right Eye Near:   Left Eye Near:    Bilateral Near:     Physical Exam Vitals reviewed.  Constitutional:      Appearance: She is well-developed.  HENT:     Mouth/Throat:     Pharynx: Posterior oropharyngeal erythema present. No oropharyngeal exudate.     Tonsils: No tonsillar exudate. 0 on the right. 0 on the left.  Cardiovascular:     Rate and Rhythm: Normal rate and regular rhythm.     Heart sounds: Normal heart sounds.  Pulmonary:     Effort: Pulmonary effort is normal.     Breath sounds: Normal breath sounds.  Skin:    General: Skin is warm and dry.  Neurological:     General: No focal deficit present.     Mental Status: She is alert.  Psychiatric:        Mood and Affect: Mood normal.        Behavior: Behavior normal.      UC Treatments / Results  Labs (all labs ordered are listed, but only abnormal results are displayed) Labs Reviewed - No data to display  EKG   Radiology No results found.  Procedures Procedures (including critical care time)  Medications Ordered in UC Medications - No data to display  Initial Impression / Assessment and Plan / UC Course  I have reviewed the triage vital signs and the nursing notes.  Pertinent labs & imaging results that were available during my care of the patient were reviewed by me and considered in my medical decision making (see chart for details).   Physical exam is largely unremarkable with lungs CTAB.  Mild pharyngeal erythema is present but no peritonsillar exudates.  Concern considering his symptoms now 2 weeks and will treat with antibiotic.  Prescribing Z-Pak.   Final Clinical Impressions(s) / UC Diagnoses   Final diagnoses:  None   Discharge Instructions   None    ED Prescriptions   None    PDMP not reviewed this encounter.   Rose Phi, FNP 08/07/22 1459    Rose Phi, FNP 08/07/22 1503

## 2022-08-07 NOTE — Discharge Instructions (Addendum)
Follow up here or with your primary care provider if your symptoms are worsening or not improving with treatment.     

## 2022-09-19 ENCOUNTER — Ambulatory Visit: Payer: Self-pay

## 2022-09-26 IMAGING — MR MR HEAD WO/W CM
15 series · 48 of 48 positions shown · IV contrast (gadavist)
Comparison: MRI July 23, 2006.

CLINICAL DATA: Chronic headaches, progressive

EXAM:
MRI HEAD WITHOUT AND WITH CONTRAST
TECHNIQUE: Multiplanar, multiecho pulse sequences of the brain and surrounding
structures were obtained without and with intravenous contrast.
CONTRAST:  7mL GADAVIST GADOBUTROL 1 MMOL/ML IV SOLN

[Series 5: ax dwi_tracew · axial · 3.0mm · 0.65mm/px · z∈[-113,+39]mm · 4 of 48 slices shown]
[im 1/48]
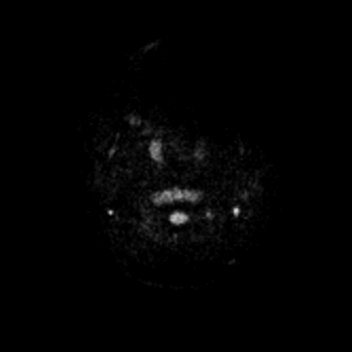
[im 16/48]
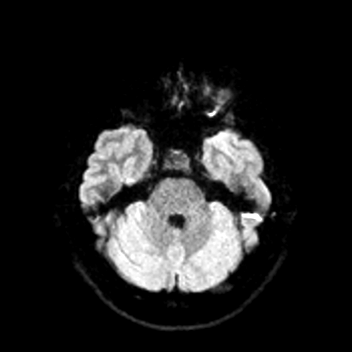
[im 32/48]
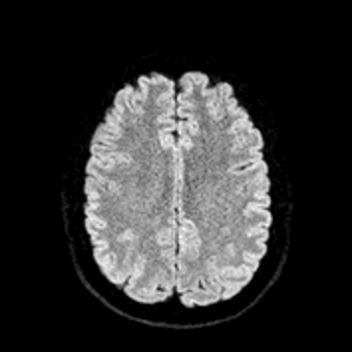
[im 48/48]
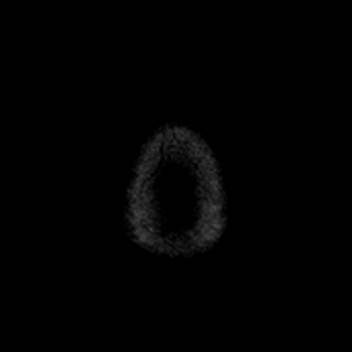

[Series 6: ax dwi_adc · axial · 3.0mm · 0.65mm/px · z∈[-113,+39]mm · 4 of 48 slices shown]
[im 1/48]
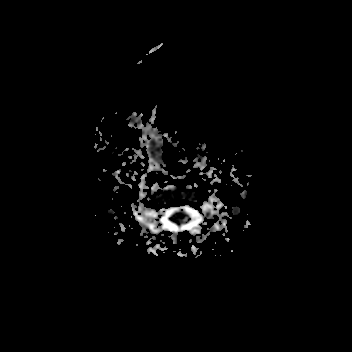
[im 16/48]
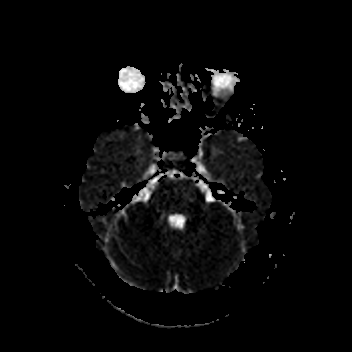
[im 32/48]
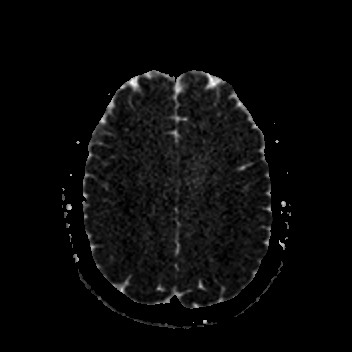
[im 48/48]
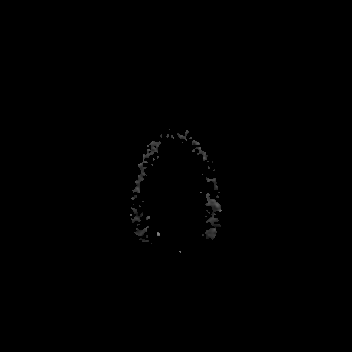

[Series 7: cor dwi_tracew · coronal · 5.0mm · 0.60mm/px · 2 of 36 slices shown]
[im 1/36]
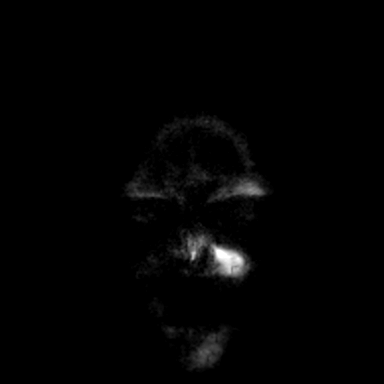
[im 36/36]
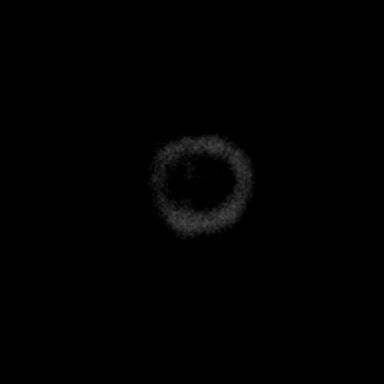

[Series 8: cor dwi_adc · coronal · 5.0mm · 0.60mm/px · 2 of 36 slices shown]
[im 1/36]
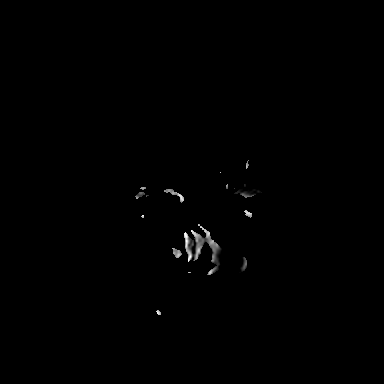
[im 36/36]
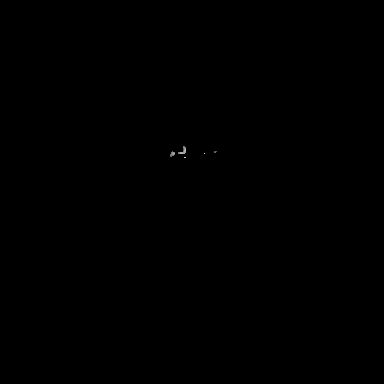

[Series 9: T1 · sagittal · 5.0mm · 0.62mm/px · 1 of 22 slices shown (1 of 2)]
[im 1/22]
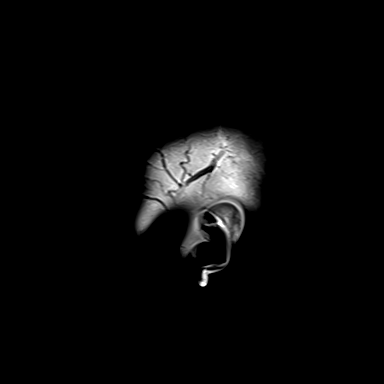

[Series 10: T2 · axial · 5.0mm · 0.53mm/px · 1 of 25 slices shown]
[im 1/25]
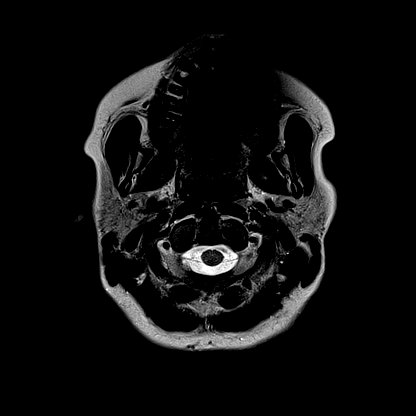

[Series 11: ax swi_mag · axial · 2.0mm · 0.90mm/px · z∈[-113,+41]mm · 4 of 80 slices shown]
[im 1/80]
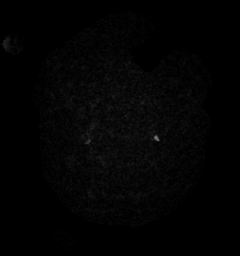
[im 27/80]
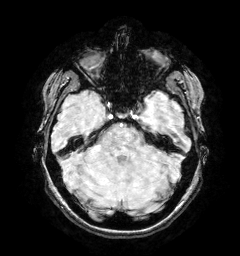
[im 53/80]
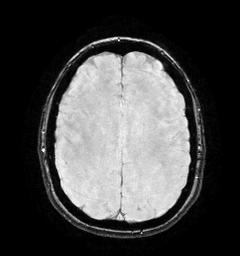
[im 80/80]
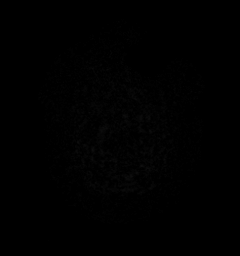

[Series 12: ax swi_pha · axial · 2.0mm · 0.90mm/px · z∈[-113,+41]mm · 4 of 80 slices shown]
[im 1/80]
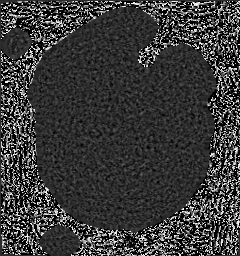
[im 27/80]
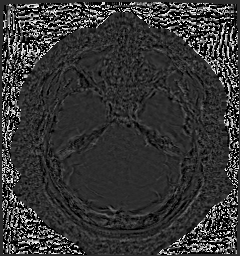
[im 53/80]
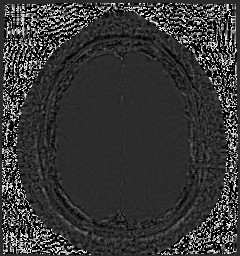
[im 80/80]
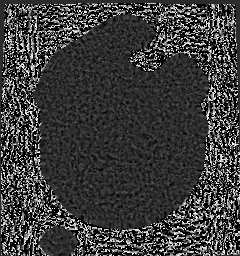

[Series 13: ax swi_swi · axial · 2.0mm · 0.90mm/px · z∈[-113,+41]mm · 4 of 80 slices shown]
[im 1/80]
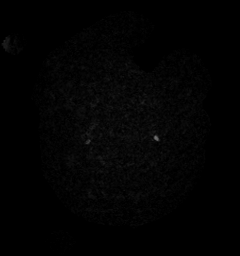
[im 27/80]
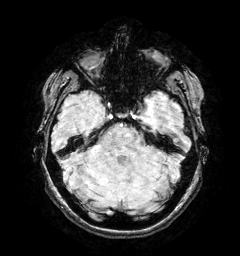
[im 53/80]
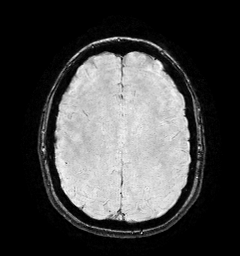
[im 80/80]
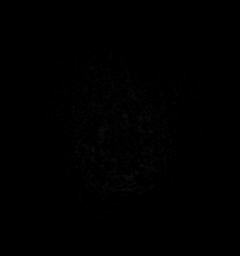

[Series 15: FLAIR · axial · 3.0mm · 0.53mm/px · z∈[-114,+44]mm · 3 of 55 slices shown (1 of 2)]
[im 1/55]
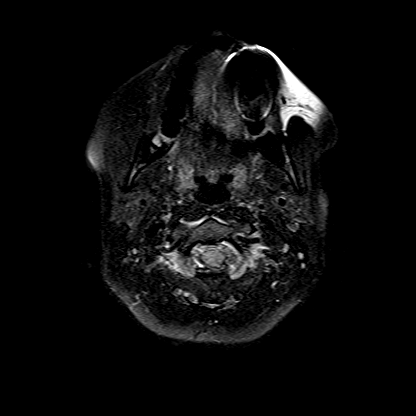
[im 28/55]
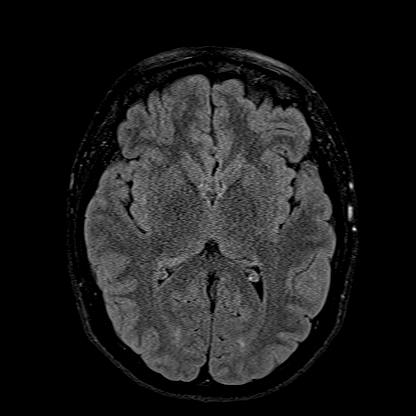
[im 55/55]
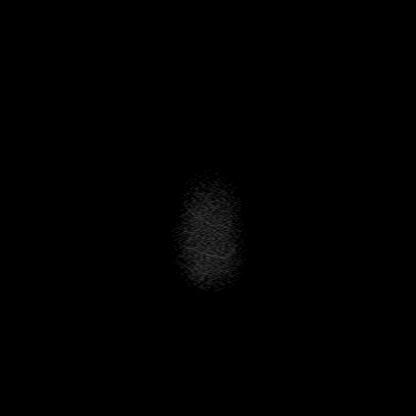

[Series 16: FLAIR · sagittal · 5.0mm · 0.94mm/px · 1 of 22 slices shown (2 of 2)]
[im 1/22]
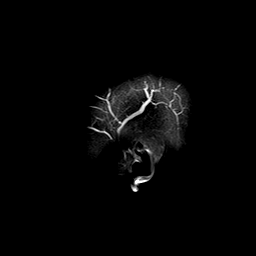

[Series 17: T1 · axial · 1.0mm · 0.98mm/px · z∈[-116,+40]mm · 8 of 160 slices shown (2 of 2)]
[im 1/160]
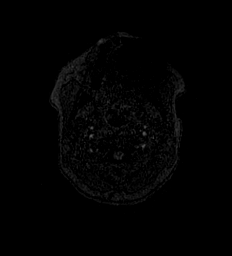
[im 23/160]
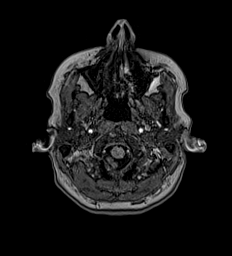
[im 46/160]
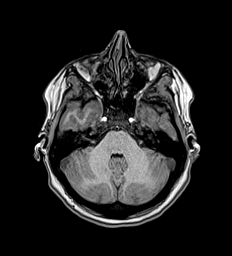
[im 69/160]
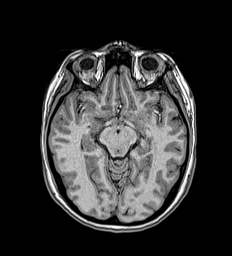
[im 91/160]
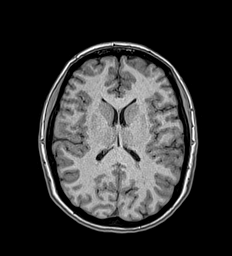
[im 114/160]
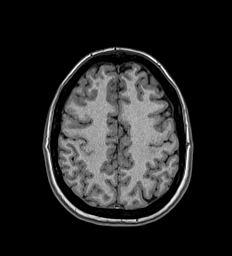
[im 137/160]
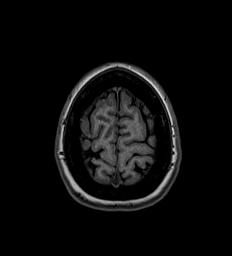
[im 160/160]
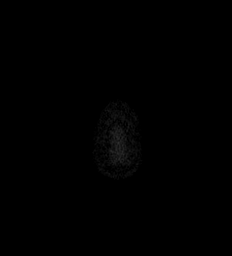

[Series 18: T2 post-contrast · coronal · 5.0mm · 0.57mm/px · 1 of 27 slices shown]
[im 1/27]
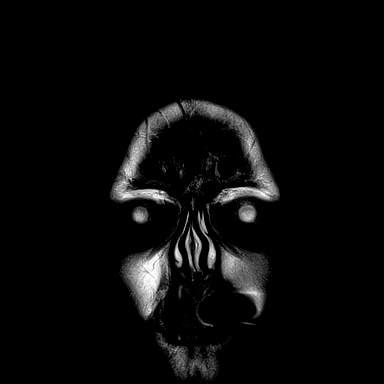

[Series 19: T1 post-contrast · axial · 1.0mm · 0.98mm/px · z∈[-116,+40]mm · 8 of 160 slices shown (1 of 2)]
[im 1/160]
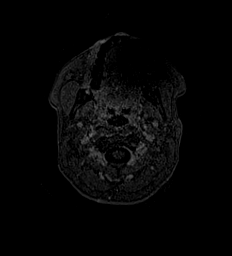
[im 23/160]
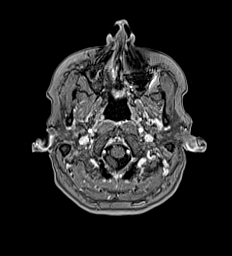
[im 46/160]
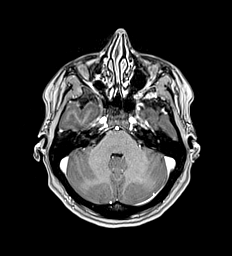
[im 69/160]
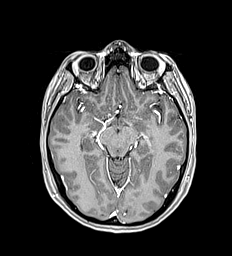
[im 91/160]
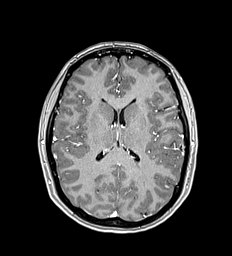
[im 114/160]
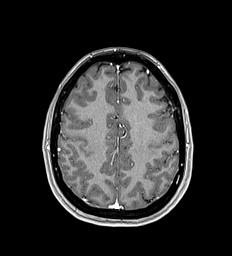
[im 137/160]
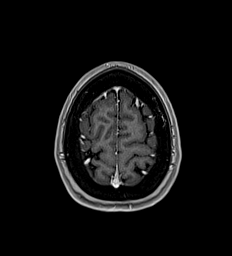
[im 160/160]
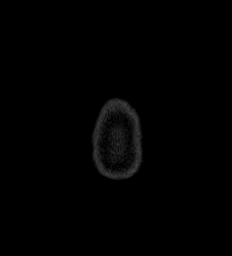

[Series 20: T1 post-contrast · coronal · 5.0mm · 0.57mm/px · 1 of 27 slices shown (2 of 2)]
[im 1/27]
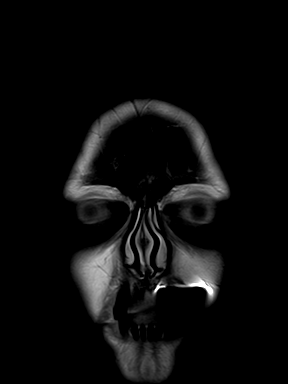

[48 of 48 positions shown; findings below may reference images not displayed]

FINDINGS: Brain: No acute infarction, hemorrhage, hydrocephalus, extra-axial
collection or mass lesion. No pathologic enhancement.

Vascular: Major arterial flow voids are maintained at the skull
base.

Skull and upper cervical spine: No focal marrow replacing lesion.
Mild diffuse T1 hypointensity of the marrow, unchanged.

Sinuses/Orbits: Clear sinuses.  No acute orbital findings.

Other: No mastoid effusions
IMPRESSION: Normal brain MRI.  No acute abnormality.

## 2023-02-15 ENCOUNTER — Ambulatory Visit: Payer: Self-pay

## 2023-05-27 ENCOUNTER — Ambulatory Visit
Admission: RE | Admit: 2023-05-27 | Discharge: 2023-05-27 | Disposition: A | Discharge status: Home / Self Care | Payer: Self-pay | Source: Ambulatory Visit | Attending: Physician Assistant | Admitting: Physician Assistant

## 2023-05-27 VITALS — BP 106/82 | HR 71 | Temp 97.5°F | Resp 16

## 2023-05-27 DIAGNOSIS — J3489 Other specified disorders of nose and nasal sinuses: Secondary | ICD-10-CM

## 2023-05-27 DIAGNOSIS — J019 Acute sinusitis, unspecified: Secondary | ICD-10-CM

## 2023-05-27 MED ORDER — MUPIROCIN 2 % EX OINT: 1.0000 | TOPICAL_OINTMENT | Freq: Two times a day (BID) | CUTANEOUS | 0 refills | Status: DC

## 2023-05-27 MED ORDER — AMOXICILLIN-POT CLAVULANATE 875-125 MG PO TABS
1.0000 | ORAL_TABLET | Freq: Two times a day (BID) | ORAL | 0 refills | Status: AC
Start: 2023-05-27 — End: 2023-06-03

## 2023-05-27 MED ORDER — AMOXICILLIN-POT CLAVULANATE 875-125 MG PO TABS
1.0000 | ORAL_TABLET | Freq: Two times a day (BID) | ORAL | 0 refills | Status: DC
Start: 2023-05-27 — End: 2023-05-27

## 2023-05-27 NOTE — ED Provider Notes (Signed)
[facility]    CSN: [id number] Arrival date & time: 05/27/23  0859      History   Chief Complaint Chief Complaint  Patient presents with   Blister    Entered by patient    HPI Maria Mcneil is a 37 y.o. female presenting for lesion inside right nostril that is painful and crusted.  Reports it is enlarging and now she notices redness and warmness of the external nose.  Also reports sinus pain and thick yellow-green drainage when she blows her nose.  Reports having a cold 2 weeks ago.  Reports her sister was recently diagnosed with a bacterial infection of the skin which sounds like impetigo.  Patient reports noticing spots on her own legs over the past month.  She describes it as spots as blistering itchy lesions with crusting.  The skin spots have cleared up for her.  Sister is currently being treated with Keflex and mupirocin and patient reports that she has gotten better.  Patient has no history of MRSA.  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 Authorizing Provider  ibuprofen (ADVIL) 600 MG tablet Take by mouth. 02/26/15  Yes [provider]  loratadine (CLARITIN) 10 MG tablet Take by mouth.   Yes [provider]  Multiple Vitamin (MULTI-VITAMIN) tablet Take by mouth.   Yes [provider]  ranitidine (ZANTAC) 150 MG tablet Take 1 tablet by mouth daily as needed.   Yes [provider]  amoxicillin-clavulanate (AUGMENTIN) 875-125 MG tablet Take 1 tablet by mouth every 12 (twelve) hours for 7 days. 05/27/23 06/03/23  Shirlee Latch, PA-C  diphenhydrAMINE (BENADRYL) 25 mg capsule Take by mouth.    [provider]  mupirocin ointment (BACTROBAN) 2 % Apply 1 Application topically 2 (two) times daily. 05/27/23   Shirlee Latch, PA-C  valACYclovir (VALTREX) 1000 MG tablet Take 1 tablet by mouth daily as needed. 10/01/16   [provider]  buPROPion (WELLBUTRIN XL) 150 MG 24 hr tablet Take by mouth. 12/16/19 11/03/20  [provider]    Family History Family History  Problem Relation  Age of Onset   Cervical cancer Paternal Grandmother 2   Lung cancer Paternal Grandfather    Rheum arthritis Mother    Brain cancer Mother    Hypertension Mother    Hypertension Father    Hyperlipidemia Father    Cervical cancer Paternal Aunt        68s, has contact    Social History Social History   Tobacco Use   Smoking status: Never   Smokeless tobacco: Never  Vaping Use   Vaping status: Never Used  Substance Use Topics   Alcohol use: No    Alcohol/week: 0.0 standard drinks of alcohol   Drug use: No     Allergies   Patient has no known allergies.   Review of Systems Review of Systems  Constitutional:  Negative for fatigue and fever.  HENT:  Positive for congestion, rhinorrhea and sinus pain. Negative for nosebleeds  and sore throat.        +nose sore  Respiratory:  Negative for cough.   Neurological:  Negative for headaches.     Physical Exam Triage Vital Signs ED Triage Vitals  Encounter Vitals Group     BP 05/27/23 0909 106/82     Systolic BP Percentile --      Diastolic BP Percentile --      Pulse Rate 05/27/23 0909 71     Resp 05/27/23 0909 16     Temp 05/27/23 0909 (!) 97.5 F (36.4 C)     Temp src --      SpO2 05/27/23 0909 100 %     Weight --      Height --      Head Circumference --      Peak Flow --      Pain Score 05/27/23 0907 0     Pain Loc --      Pain Education --      Exclude from Growth Chart --    No data found.  Updated Vital Signs BP 106/82 (BP Location: Left Arm)   Pulse 71   Temp (!) 97.5 F (36.4 C)   Resp 16   LMP 05/26/2023   SpO2 100%     Physical Exam Vitals and nursing note reviewed.  Constitutional:      General: She is not in acute distress.    Appearance: Normal appearance. She is not ill-appearing or toxic-appearing.  HENT:     Head: Normocephalic and atraumatic.     Nose: Mucosal edema and congestion present.     Comments: Erythema and swelling noted to inner right nostril and part of the outside of the nostril.  Tenderness noted.    Mouth/Throat:     Mouth: Mucous membranes are moist.     Pharynx: Oropharynx is clear.  Eyes:     General: No scleral icterus.       Right eye: No discharge.        Left eye: No discharge.     Conjunctiva/sclera: Conjunctivae normal.  Cardiovascular:     Rate and Rhythm: Normal rate.  Pulmonary:     Effort: Pulmonary effort is normal. No respiratory distress.  Musculoskeletal:     Cervical back: Neck supple.  Skin:    General: Skin is dry.  Neurological:     General: No focal deficit present.     Mental Status: She is alert. Mental status is at baseline.     Motor: No weakness.     Gait: Gait normal.  Psychiatric:  Mood and Affect: Mood normal.        Behavior: Behavior normal.         Thought Content: Thought content normal.      UC Treatments / Results  Labs (all labs ordered are listed, but only abnormal results are displayed) Labs Reviewed - No data to display  EKG   Radiology No results found.  Procedures Procedures (including critical care time)  Medications Ordered in UC Medications - No data to display  Initial Impression / Assessment and Plan / UC Course  I have reviewed the triage vital signs and the nursing notes.  Pertinent labs & imaging results that were available during my care of the patient were reviewed by me and considered in my medical decision making (see chart for details).   37 year old female presents for nasal pain, redness and crusting lesions as well as sinus pain and yellow-green nasal drainage.  Sister recently diagnosed with impetigo.  Her children were diagnosed with strep not too long ago.  No OTC meds have helped.  On exam she does have erythema and swelling noted to be in her right nostril and sinus tenderness.  Suspect acute sinusitis and possible impetigo.  Will treat this time with Augmentin and mupirocin ointment.  Supportive care.  Reviewed return precautions.   Final Clinical Impressions(s) / UC Diagnoses   Final diagnoses:  Acute sinusitis, recurrence not specified, unspecified location  Internal nasal lesion     Discharge Instructions      -You have a bacterial infection in your nose and sinuses.  I sent antibiotics and topical ointment to the pharmacy. - Increase rest and fluids. - Clean the area inside your nose with salt water on a Q-tip. -You should start to be improving in a few days with finish out the antibiotics.     ED Prescriptions     Medication Sig Dispense Auth. Provider   mupirocin ointment (BACTROBAN) 2 %  (Status: Discontinued) Apply 1 Application topically 2 (two) times daily. 22 g Eusebio Friendly B, PA-C   amoxicillin-clavulanate (AUGMENTIN) 875-125 MG tablet  (Status: Discontinued) Take  1 tablet by mouth every 12 (twelve) hours for 7 days. 14 tablet Eusebio Friendly B, PA-C   amoxicillin-clavulanate (AUGMENTIN) 875-125 MG tablet Take 1 tablet by mouth every 12 (twelve) hours for 7 days. 14 tablet Eusebio Friendly B, PA-C   mupirocin ointment (BACTROBAN) 2 % Apply 1 Application topically 2 (two) times daily. 22 g Shirlee Latch, PA-C      PDMP not reviewed this encounter.   Shirlee Latch, PA-C 05/27/23 1015

## 2023-05-27 NOTE — Discharge Instructions (Addendum)
-  You have a bacterial infection in your nose and sinuses.  I sent antibiotics and topical ointment to the pharmacy. - Increase rest and fluids. - Clean the area inside your nose with salt water on a Q-tip. -You should start to be improving in a few days with finish out the antibiotics.

## 2023-05-27 NOTE — ED Triage Notes (Signed)
Pt presents with a sore inside of her nose x 1 week.

## 2023-08-07 ENCOUNTER — Ambulatory Visit
Admission: RE | Admit: 2023-08-07 | Discharge: 2023-08-07 | Disposition: A | Discharge status: Home / Self Care | Payer: Self-pay | Source: Ambulatory Visit | Attending: Emergency Medicine | Admitting: Emergency Medicine

## 2023-08-07 VITALS — BP 108/77 | HR 95 | Temp 98.4°F | Resp 18

## 2023-08-07 DIAGNOSIS — J069 Acute upper respiratory infection, unspecified: Secondary | ICD-10-CM

## 2023-08-07 LAB — SARS CORONAVIRUS 2 BY RT PCR: SARS Coronavirus 2 by RT PCR: NEGATIVE

## 2023-08-07 LAB — GROUP A STREP BY PCR: Group A Strep by PCR: NOT DETECTED

## 2023-08-07 MED ORDER — IPRATROPIUM BROMIDE 0.06 % NA SOLN: 2.0000 | Freq: Four times a day (QID) | NASAL | 12 refills | Status: DC

## 2023-08-07 MED ORDER — BENZONATATE 100 MG PO CAPS: 200.0000 mg | ORAL_CAPSULE | Freq: Three times a day (TID) | ORAL | 0 refills | Status: DC

## 2023-08-07 MED ORDER — PROMETHAZINE-DM 6.25-15 MG/5ML PO SYRP: 5.0000 mL | ORAL_SOLUTION | Freq: Four times a day (QID) | ORAL | 0 refills | Status: DC | PRN

## 2023-08-07 NOTE — Discharge Instructions (Addendum)
Your test today for COVID and strep are both negative.  I do believe you have a viral respiratory infection based upon your symptoms.  Use over-the-counter Tylenol and/or ibuprofen according to the package instructions as needed for any body aches or fever you may develop.  Use the Atrovent nasal spray, 2 squirts in each nostril every 6 hours, as needed for runny nose and postnasal drip.  Use the Tessalon Perles every 8 hours during the day.  Take them with a small sip of water.  They may give you some numbness to the base of your tongue or a metallic taste in your mouth, this is normal.  Use the Promethazine DM cough syrup at bedtime for cough and congestion.  It will make you drowsy so do not take it during the day.  Return for reevaluation or see your primary care provider for any new or worsening symptoms.

## 2023-08-07 NOTE — ED Triage Notes (Signed)
Sore throat headache-bodyaches-fever-runny nose-sneezing. X 3 days.   Nyquil dayquil IBU last dose of IBU a hour ago.

## 2023-08-07 NOTE — ED Provider Notes (Signed)
[facility]    CSN: [id number] Arrival date & time: 08/07/23  1339      History   Chief Complaint Chief Complaint  Patient presents with   Sore Throat    Entered by patient    HPI Maria Mcneil is a 37 y.o. female.   HPI  37 year old female with a past medical history significant for preeclampsia and GERD presents for evaluation of 3 days worth of respiratory symptoms that include a fever with a Tmax of 102, headache, runny nose, sneezing, sore throat, body aches, and nonproductive cough.  No shortness of breath or wheezing.  Her sister had similar symptoms last week but did not go to the doctor for a diagnosis.  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 Authorizing Provider  benzonatate (TESSALON) 100 MG capsule Take 2 capsules (200 mg total) by mouth every 8 (eight) hours. 08/07/23  Yes  Becky Augusta, NP  ipratropium (ATROVENT) 0.06 % nasal spray Place 2 sprays into both nostrils 4 (four) times daily. 08/07/23  Yes Becky Augusta, NP  promethazine-dextromethorphan (PROMETHAZINE-DM) 6.25-15 MG/5ML syrup Take 5 mLs by mouth 4 (four) times daily as needed. 08/07/23  Yes Becky Augusta, NP  diphenhydrAMINE (BENADRYL) 25 mg capsule Take by mouth.    [provider]  ibuprofen (ADVIL) 600 MG tablet Take by mouth. 02/26/15   [provider]  loratadine (CLARITIN) 10 MG tablet Take by mouth.    [provider]  Multiple Vitamin (MULTI-VITAMIN) tablet Take by mouth.    [provider]  mupirocin ointment (BACTROBAN) 2 % Apply 1 Application topically 2 (two) times daily. 05/27/23   Shirlee Latch, PA-C  ranitidine (ZANTAC) 150 MG tablet Take 1 tablet by mouth daily as needed.    [provider]  valACYclovir (VALTREX) 1000 MG tablet Take 1 tablet by mouth daily as needed. 10/01/16   [provider]  buPROPion (WELLBUTRIN XL) 150 MG 24 hr tablet Take by mouth. 12/16/19 11/03/20  [provider]    Family History Family History  Problem Relation Age of Onset   Cervical cancer Paternal Grandmother 2  Lung cancer Paternal Grandfather    Rheum arthritis Mother    Brain cancer Mother    Hypertension Mother    Hypertension Father    Hyperlipidemia Father    Cervical cancer Paternal Aunt        13s, has contact    Social History Social History   Tobacco Use   Smoking status: Never   Smokeless tobacco: Never  Vaping Use   Vaping status: Never Used  Substance Use Topics   Alcohol use: No    Alcohol/week: 0.0 standard drinks of alcohol   Drug use: No     Allergies   Patient has no known allergies.   Review of Systems Review of Systems  Constitutional:  Positive for fever.  HENT:  Positive for congestion, rhinorrhea, sneezing and sore throat. Negative for ear pain.   Respiratory:  Positive for cough. Negative for  shortness of breath and wheezing.   Musculoskeletal:  Positive for arthralgias and myalgias.  Neurological:  Positive for headaches.     Physical Exam Triage Vital Signs ED Triage Vitals  Encounter Vitals Group     BP 08/07/23 1353 108/77     Systolic BP Percentile --      Diastolic BP Percentile --      Pulse Rate 08/07/23 1353 95     Resp 08/07/23 1353 18     Temp 08/07/23 1353 98.4 F (36.9 C)     Temp Source 08/07/23 1353 Oral     SpO2 08/07/23 1353 98 %     Weight --      Height --      Head Circumference --      Peak Flow --      Pain Score 08/07/23 1352 5     Pain Loc --      Pain Education --      Exclude from Growth Chart --    No data found.  Updated Vital Signs BP 108/77 (BP Location: Right Arm)   Pulse 95   Temp 98.4 F (36.9 C) (Oral)   Resp 18   LMP 07/24/2023 (Approximate)   SpO2 98%   Visual Acuity Right Eye Distance:   Left Eye Distance:   Bilateral Distance:    Right Eye Near:   Left Eye Near:    Bilateral Near:     Physical Exam Vitals and nursing note reviewed.  Constitutional:      Appearance: Normal appearance. She is ill-appearing.  HENT:     Head: Normocephalic and atraumatic.     Right Ear: Tympanic membrane, ear canal and external ear normal. There is no impacted cerumen.     Left Ear: Tympanic membrane, ear canal and external ear normal. There is no impacted cerumen.     Nose: Congestion and rhinorrhea present.     Comments: Nasal mucosa is erythematous and edematous with clear discharge in both nares.    Mouth/Throat:     Mouth: Mucous membranes are moist.     Pharynx: Oropharynx is clear. Posterior oropharyngeal erythema present. No oropharyngeal exudate.     Comments: Mild erythema to the soft palate the tonsillar pillars are unremarkable.  Posterior oropharynx demonstrates erythema with clear postnasal drip. Cardiovascular:     Rate and Rhythm: Normal rate and regular rhythm.     Pulses: Normal pulses.     Heart sounds:  Normal heart sounds. No murmur heard.    No friction rub. No gallop.  Pulmonary:     Effort: Pulmonary effort is normal.  Breath sounds: Normal breath sounds. No wheezing, rhonchi or rales.  Musculoskeletal:     Cervical back: Normal range of motion and neck supple. No tenderness.  Lymphadenopathy:     Cervical: No cervical adenopathy.  Skin:    General: Skin is warm and dry.     Capillary Refill: Capillary refill takes less than 2 seconds.     Findings: No rash.  Neurological:     General: No focal deficit present.     Mental Status: She is alert and oriented to person, place, and time.      UC Treatments / Results  Labs (all labs ordered are listed, but only abnormal results are displayed) Labs Reviewed  GROUP A STREP BY PCR  SARS CORONAVIRUS 2 BY RT PCR    EKG   Radiology No results found.  Procedures Procedures (including critical care time)  Medications Ordered in UC Medications - No data to display  Initial Impression / Assessment and Plan / UC Course  I have reviewed the triage vital signs and the nursing notes.  Pertinent labs & imaging results that were available during my care of the patient were reviewed by me and considered in my medical decision making (see chart for details).   Patient is a pleasant, though mildly ill-appearing, 37 year old female presenting for evaluation of 3 days with the respiratory symptoms as outlined HPI above.  Patient is able to speak in full sentence without dyspnea or tachypnea and she has a respiratory rate of 18 at triage with a 98% room air oxygen saturation.  She is afebrile in clinic with an oral temp of 98.4.  She does endorse having a fever at home with a Tmax of 102.  Her exam does reveal inflammation of her upper respiratory tract as well as mild erythema to her posterior oropharynx.  I will order a strep PCR to evaluate for the presence of strep.  Additionally, I will order a COVID PCR to evaluate for the presence of  COVID.  Influenza is also on the differential, however she has had symptoms for 3 days and is outside the therapeutic window for Tamiflu so I will not test for influenza at this time.  Strep PCR is negative.  COVID PCR is negative.  I will discharge patient on the diagnosis of viral URI with a cough and treated with Atrovent nasal spray, Tessalon Perles, Promethazine DM cough syrup.  She can use over-the-counter Tylenol and/or ibuprofen as needed for fever or pain.  Return precautions reviewed.  Work note provided.   Final Clinical Impressions(s) / UC Diagnoses   Final diagnoses:  Viral URI with cough     Discharge Instructions      Your test today for COVID and strep are both negative.  I do believe you have a viral respiratory infection based upon your symptoms.  Use over-the-counter Tylenol and/or ibuprofen according to the package instructions as needed for any body aches or fever you may develop.  Use the Atrovent nasal spray, 2 squirts in each nostril every 6 hours, as needed for runny nose and postnasal drip.  Use the Tessalon Perles every 8 hours during the day.  Take them with a small sip of water.  They may give you some numbness to the base of your tongue or a metallic taste in your mouth, this is normal.  Use the Promethazine DM cough syrup at bedtime for cough and congestion.  It will make you drowsy so do not take it during the day.  Return for reevaluation or see your primary care provider for any new or worsening symptoms.      ED Prescriptions     Medication Sig Dispense Auth. Provider   benzonatate (TESSALON) 100 MG capsule Take 2 capsules (200 mg total) by mouth every 8 (eight) hours. 21 capsule Becky Augusta, NP   ipratropium (ATROVENT) 0.06 % nasal spray Place 2 sprays into both nostrils 4 (four) times daily. 15 mL Becky Augusta, NP   promethazine-dextromethorphan (PROMETHAZINE-DM) 6.25-15 MG/5ML syrup Take 5 mLs by mouth 4 (four) times daily as needed. 118  mL Becky Augusta, NP      PDMP not reviewed this encounter.   Becky Augusta, NP 08/07/23 1446

## 2023-12-31 ENCOUNTER — Ambulatory Visit: Payer: Self-pay

## 2024-03-23 DIAGNOSIS — N951 Menopausal and female climacteric states: Secondary | ICD-10-CM | POA: Insufficient documentation

## 2024-03-23 DIAGNOSIS — R5383 Other fatigue: Secondary | ICD-10-CM | POA: Insufficient documentation

## 2024-06-22 ENCOUNTER — Other Ambulatory Visit: Payer: Self-pay | Admitting: Gerontology

## 2024-06-22 ENCOUNTER — Ambulatory Visit: Payer: Self-pay

## 2024-06-22 DIAGNOSIS — R1032 Left lower quadrant pain: Secondary | ICD-10-CM

## 2024-06-23 ENCOUNTER — Ambulatory Visit
Admission: RE | Admit: 2024-06-23 | Discharge: 2024-06-23 | Disposition: A | Discharge status: Home / Self Care | Payer: Self-pay | Source: Ambulatory Visit | Attending: Gerontology | Admitting: Gerontology

## 2024-06-23 ENCOUNTER — Other Ambulatory Visit: Payer: Self-pay | Admitting: Gerontology

## 2024-06-23 DIAGNOSIS — R1032 Left lower quadrant pain: Secondary | ICD-10-CM

## 2024-06-23 MED ORDER — IOPAMIDOL (ISOVUE-300) INJECTION 61%
100.0000 mL | Freq: Once | INTRAVENOUS | Status: AC | PRN
  Administered 2024-06-23: 100 mL via INTRAVENOUS

## 2024-07-23 ENCOUNTER — Ambulatory Visit
Admission: RE | Admit: 2024-07-23 | Discharge: 2024-07-23 | Disposition: A | Discharge status: Home / Self Care | Payer: Self-pay | Source: Ambulatory Visit | Attending: Emergency Medicine | Admitting: Emergency Medicine

## 2024-07-23 ENCOUNTER — Ambulatory Visit: Payer: Self-pay

## 2024-07-23 VITALS — BP 123/86 | HR 84 | Temp 97.7°F | Resp 18 | Wt 167.1 lb

## 2024-07-23 DIAGNOSIS — J069 Acute upper respiratory infection, unspecified: Secondary | ICD-10-CM

## 2024-07-23 MED ORDER — IPRATROPIUM BROMIDE 0.06 % NA SOLN: 2.0000 | Freq: Four times a day (QID) | NASAL | 12 refills | Status: DC

## 2024-07-23 MED ORDER — PROMETHAZINE-DM 6.25-15 MG/5ML PO SYRP: 5.0000 mL | ORAL_SOLUTION | Freq: Four times a day (QID) | ORAL | 0 refills | Status: DC | PRN

## 2024-07-23 MED ORDER — BENZONATATE 100 MG PO CAPS: 200.0000 mg | ORAL_CAPSULE | Freq: Three times a day (TID) | ORAL | 0 refills | Status: DC

## 2024-07-23 MED ORDER — AMOXICILLIN-POT CLAVULANATE 875-125 MG PO TABS: 1.0000 | ORAL_TABLET | Freq: Two times a day (BID) | ORAL | 0 refills | Status: AC

## 2024-07-23 NOTE — ED Provider Notes (Signed)
 [facility]    CSN: [id number] Arrival date & time: 07/23/24  1258      History   Chief Complaint Chief Complaint  Patient presents with   Cough    Entered by patient    HPI Maria  D Mcneil is a 38 y.o. female.   HPI  38 year old female with past medical history significant for GERD and pregnancy-induced hypertension presents for evaluation of 2 weeks worth of respiratory symptoms to include fever at the onset which has resolved, nasal congestion with clear nasal discharge, productive cough for yellow sputum, and burning in her chest.  She denies any shortness of breath or wheezing.  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 Authorizing Provider  amoxicillin -clavulanate (AUGMENTIN ) 875-125 MG tablet Take 1 tablet by mouth every 12 (twelve) hours for 7 days. 07/23/24 07/30/24 Yes Bernardino Ditch,  NP  diphenhydrAMINE  (BENADRYL ) 25 mg capsule Take by mouth.   Yes [provider]  ibuprofen  (ADVIL ) 600 MG tablet Take by mouth. 02/26/15  Yes [provider]  loratadine (CLARITIN) 10 MG tablet Take by mouth.   Yes [provider]  Multiple Vitamin (MULTI-VITAMIN) tablet Take by mouth.   Yes [provider]  ranitidine (ZANTAC) 150 MG tablet Take 1 tablet by mouth daily as needed.   Yes [provider]  valACYclovir (VALTREX) 1000 MG tablet Take 1 tablet by mouth daily as needed. 10/01/16  Yes [provider]  benzonatate  (TESSALON ) 100 MG capsule Take 2 capsules (200 mg total) by mouth every 8 (eight) hours. 07/23/24   Bernardino Ditch, NP  ipratropium (ATROVENT ) 0.06 % nasal spray Place 2 sprays into both nostrils 4 (four) times daily. 07/23/24   Bernardino Ditch, NP  promethazine -dextromethorphan (PROMETHAZINE -DM) 6.25-15 MG/5ML syrup Take 5 mLs by mouth 4 (four) times daily as needed. 07/23/24   Bernardino Ditch, NP  buPROPion (WELLBUTRIN XL) 150 MG 24 hr tablet Take by mouth. 12/16/19 11/03/20  [provider]    Family History Family History  Problem Relation Age of Onset   Cervical cancer Paternal Grandmother 22   Lung cancer Paternal Grandfather    Rheum  arthritis Mother    Brain cancer Mother    Hypertension Mother    Hypertension Father    Hyperlipidemia Father    Cervical cancer Paternal Aunt        24s, has contact    Social History Social History   Tobacco Use   Smoking status: Never   Smokeless tobacco: Never  Vaping Use   Vaping status: Never Used  Substance Use Topics   Alcohol use: No    Alcohol/week: 0.0 standard drinks of alcohol   Drug use: No     Allergies   Patient has no known allergies.   Review of Systems Review of Systems  Constitutional:  Positive for fever.  HENT:  Positive for congestion, rhinorrhea and sinus pressure. Negative for ear pain and sore throat.   Respiratory:  Positive for cough.  Negative for shortness of breath and wheezing.      Physical Exam Triage Vital Signs ED Triage Vitals  Encounter Vitals Group     BP      Girls Systolic BP Percentile      Girls Diastolic BP Percentile      Boys Systolic BP Percentile      Boys Diastolic BP Percentile      Pulse      Resp      Temp      Temp src      SpO2      Weight      Height      Head Circumference      Peak Flow      Pain Score      Pain Loc      Pain Education      Exclude from Growth Chart    No data found.  Updated Vital Signs BP 123/86 (BP Location: Right Arm)   Pulse 84   Temp 97.7 F (36.5 C) (Oral)   Resp 18   Wt 167 lb 1.6 oz (75.8 kg)   LMP 07/15/2024   SpO2 100%   BMI 30.56 kg/m   Visual Acuity Right Eye Distance:   Left Eye Distance:   Bilateral Distance:    Right Eye Near:   Left Eye Near:    Bilateral Near:     Physical Exam Vitals and nursing note reviewed.  Constitutional:      Appearance: Normal appearance. She is not ill-appearing.  HENT:     Head: Normocephalic and atraumatic.     Right Ear: Tympanic membrane, ear canal and external ear normal. There is no impacted cerumen.     Left Ear: Tympanic membrane, ear canal and external ear normal. There is no impacted cerumen.     Nose: Congestion and rhinorrhea present.     Comments: His mucosa is erythematous and edematous with yellow discharge in both nares.  Bilateral maxillary sinuses are tender to compression.    Mouth/Throat:     Mouth: Mucous membranes are moist.     Pharynx: Oropharynx is clear. Posterior oropharyngeal erythema present. No oropharyngeal exudate.     Comments: Erythema to the posterior oropharynx with yellow postnasal drip. Cardiovascular:     Rate and Rhythm: Normal rate and regular rhythm.     Pulses: Normal pulses.     Heart sounds: Normal heart sounds. No murmur heard.    No friction rub. No gallop.  Pulmonary:     Effort: Pulmonary effort is normal.     Breath sounds: Normal breath  sounds. No wheezing, rhonchi or rales.  Musculoskeletal:  Cervical back: Normal range of motion and neck supple. No tenderness.  Lymphadenopathy:     Cervical: No cervical adenopathy.  Skin:    General: Skin is warm and dry.     Capillary Refill: Capillary refill takes less than 2 seconds.     Findings: No rash.  Neurological:     General: No focal deficit present.     Mental Status: She is alert and oriented to person, place, and time.      UC Treatments / Results  Labs (all labs ordered are listed, but only abnormal results are displayed) Labs Reviewed - No data to display  EKG   Radiology No results found.  Procedures Procedures (including critical care time)  Medications Ordered in UC Medications - No data to display  Initial Impression / Assessment and Plan / UC Course  I have reviewed the triage vital signs and the nursing notes.  Pertinent labs & imaging results that were available during my care of the patient were reviewed by me and considered in my medical decision making (see chart for details).   Patient is a pleasant, nontoxic-appearing 38 year old female presenting for evaluation of 2 weeks with the respiratory symptoms as outlined in HPI above.  She reports that she has taken a home COVID and flu test that were both negative.  She reports that she continues to feel worse and that her symptoms have not improved other than her fever has resolved.  She is able to speak in full sentences without dyspnea or tachypnea though talking for prolonged length of time does trigger a cough.  Her physical exam does reveal inflammation of her upper respiratory tract as evidenced by inflamed nasal mucosa with yellow nasal discharge.  She also has tenderness to bilateral maxillary sinuses to compression.  Oropharyngeal exam reveals erythema to the posterior oropharynx with yellow postnasal drip.  Her lungs are clear to auscultation all fields.  I suspect that her cough is being  driven by postnasal drip.  Given that she has had symptoms for 2 weeks I do feel a trial of antibiotics is warranted.  I will discharge her home on Augmentin  875 mg twice daily with food for 7 days for her URI.  Atrovent  nasal spray for the congestion and postnasal drip.  Tessalon  Perles and Promethazine  DM cough syrup for cough and congestion.  Return precautions reviewed.  She denies need for work note.   Final Clinical Impressions(s) / UC Diagnoses   Final diagnoses:  URI with cough and congestion     Discharge Instructions      Take the Augmentin  twice daily with food for 7 days for treatment of your upper respiratory tract infection.  Use over-the-counter Tylenol  and/or ibuprofen  as needed for any fever or pain.  Use the Atrovent  nasal spray, 2 squirts in each nostril every 6 hours, as needed for runny nose and postnasal drip.  Use the Tessalon  Perles every 8 hours during the day.  Take them with a small sip of water.  They may give you some numbness to the base of your tongue or a metallic taste in your mouth, this is normal.  Use the Promethazine  DM cough syrup at bedtime for cough and congestion.  It will make you drowsy so do not take it during the day.  Return for reevaluation or see your primary care provider for any new or worsening symptoms.      ED Prescriptions     Medication Sig Dispense Auth. Provider   benzonatate  (  TESSALON ) 100 MG capsule Take 2 capsules (200 mg total) by mouth every 8 (eight) hours. 21 capsule Bernardino Ditch, NP   ipratropium (ATROVENT ) 0.06 % nasal spray Place 2 sprays into both nostrils 4 (four) times daily. 15 mL Bernardino Ditch, NP   promethazine -dextromethorphan (PROMETHAZINE -DM) 6.25-15 MG/5ML syrup Take 5 mLs by mouth 4 (four) times daily as needed. 118 mL Bernardino Ditch, NP   amoxicillin -clavulanate (AUGMENTIN ) 875-125 MG tablet Take 1 tablet by mouth every 12 (twelve) hours for 7 days. 14 tablet Bernardino Ditch, NP      PDMP not reviewed this  encounter.   Bernardino Ditch, NP 07/23/24 1336

## 2024-07-23 NOTE — Discharge Instructions (Addendum)
 Take the Augmentin  twice daily with food for 7 days for treatment of your upper respiratory tract infection.  Use over-the-counter Tylenol  and/or ibuprofen  as needed for any fever or pain.  Use the Atrovent  nasal spray, 2 squirts in each nostril every 6 hours, as needed for runny nose and postnasal drip.  Use the Tessalon  Perles every 8 hours during the day.  Take them with a small sip of water.  They may give you some numbness to the base of your tongue or a metallic taste in your mouth, this is normal.  Use the Promethazine  DM cough syrup at bedtime for cough and congestion.  It will make you drowsy so do not take it during the day.  Return for reevaluation or see your primary care provider for any new or worsening symptoms.

## 2024-07-23 NOTE — ED Triage Notes (Signed)
 Pt c/o cough x1-2weeks  Pt states that she had a cold and took a home test for flu and covid and they were negative.   Pt states that she never got better and her nasal draiange and cough are getting worse.  Pt states that her chest feels on fire

## 2024-11-11 ENCOUNTER — Ambulatory Visit
Admission: RE | Admit: 2024-11-11 | Discharge: 2024-11-11 | Disposition: A | Discharge status: Home / Self Care | Payer: Self-pay | Source: Ambulatory Visit

## 2024-11-11 ENCOUNTER — Ambulatory Visit: Payer: Self-pay

## 2024-11-11 VITALS — BP 112/81 | HR 90 | Temp 97.9°F | Resp 16 | Wt 168.5 lb

## 2024-11-11 DIAGNOSIS — F419 Anxiety disorder, unspecified: Secondary | ICD-10-CM | POA: Insufficient documentation

## 2024-11-11 DIAGNOSIS — J029 Acute pharyngitis, unspecified: Secondary | ICD-10-CM

## 2024-11-11 DIAGNOSIS — J069 Acute upper respiratory infection, unspecified: Secondary | ICD-10-CM

## 2024-11-11 LAB — POCT RAPID STREP A (OFFICE): Rapid Strep A Screen: NEGATIVE

## 2024-11-11 MED ORDER — PROMETHAZINE-DM 6.25-15 MG/5ML PO SYRP: 5.0000 mL | ORAL_SOLUTION | Freq: Four times a day (QID) | ORAL | 0 refills | Status: AC | PRN

## 2024-11-11 MED ORDER — AMOXICILLIN-POT CLAVULANATE 875-125 MG PO TABS: 1.0000 | ORAL_TABLET | Freq: Two times a day (BID) | ORAL | 0 refills | Status: AC

## 2024-11-11 MED ORDER — AEROCHAMBER MV MISC: 2 refills | Status: AC

## 2024-11-11 MED ORDER — IPRATROPIUM BROMIDE 0.06 % NA SOLN: 2.0000 | Freq: Four times a day (QID) | NASAL | 12 refills | Status: AC

## 2024-11-11 MED ORDER — BENZONATATE 100 MG PO CAPS: 200.0000 mg | ORAL_CAPSULE | Freq: Three times a day (TID) | ORAL | 0 refills | Status: AC

## 2024-11-11 MED ORDER — ALBUTEROL SULFATE HFA 108 (90 BASE) MCG/ACT IN AERS: 2.0000 | INHALATION_SPRAY | RESPIRATORY_TRACT | 0 refills | Status: AC | PRN

## 2024-11-11 NOTE — ED Provider Notes (Signed)
 " [facility]    CSN: [id number] Arrival date & time: 11/11/24  0904      History   Chief Complaint Chief Complaint  Patient presents with   Cough    HPI Maria Mcneil is a 39 y.o. female.   HPI  39 year old female with past medical history significant for GERD, anxiety,, HSV-2, migraine headaches, and MVP presents for evaluation of 5 days worth of respiratory symptoms.  No fevers but she has had runny nose nasal congestion, ear pressure, sore throat, intermittently productive cough or green sputum, shortness breath, wheezing.  She reports that one of her coworkers had similar symptoms and was diagnosed with some bacterial infection and was out last week.  No recent travel.  Past Medical History:  Diagnosis Date   GERD (gastroesophageal reflux disease)    History of Papanicolaou smear of cervix 01/07/2012; 04/29/15   neg; neg   Pre-eclampsia    Pregnancy induced hypertension     Patient Active Problem List   Diagnosis Date Noted   Anxiety 11/11/2024   Fatigue 03/23/2024   Perimenopause 03/23/2024   Dry eye syndrome, bilateral 01/01/2022   Obesity (BMI 30.0-34.9) 01/01/2022   Family history of rheumatoid arthritis 12/29/2021   Fibromyalgia 09/23/2020   Menometrorrhagia 02/19/2020   Dyspepsia 01/29/2020   Psychophysiological insomnia 01/29/2020   Adenomyosis 11/13/2017   Abnormal uterine bleeding 11/13/2017   Previous cesarean delivery, delivered 02/24/2015   Clinical depression 02/24/2015   Migraine headache 03/04/2014   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   Dyspnea on exertion 02/19/2014   Mitral valve prolapse 02/19/2014    Past Surgical History:  Procedure Laterality Date   CESAREAN SECTION  2008   CESAREAN SECTION WITH BILATERAL TUBAL LIGATION N/A 02/24/2015   Procedure: CESAREAN SECTION WITH BILATERAL TUBAL LIGATION;  Surgeon: Lamar SHAUNNA Lesches, MD;  Location: [facility] ORS;  Service: Obstetrics;  Laterality: N/A;    OB History     Gravida  2    Para  2   Term  2   Preterm      AB      Living  2      SAB      IAB      Ectopic      Multiple  0   Live Births  2            Home Medications    Prior to Admission medications  Medication Sig Start Date End Date Taking? Authorizing Provider  albuterol  (VENTOLIN  HFA) 108 (90 Base) MCG/ACT inhaler Inhale 2 puffs into the lungs every 4 (four) hours as needed. 11/11/24  Yes Bernardino Ditch, NP  amoxicillin -clavulanate (AUGMENTIN ) 875-125 MG tablet Take 1 tablet by mouth every 12 (twelve) hours for 7 days. 11/11/24 11/18/24 Yes Bernardino Ditch, NP  norgestrel-ethinyl estradiol (LO/OVRAL) 0.3-30 MG-MCG tablet Take 1 tablet by mouth daily. 06/30/24 06/30/25 Yes [provider]  Spacer/Aero-Holding Chambers (AEROCHAMBER MV) inhaler Use as instructed 11/11/24  Yes Bernardino Ditch, NP  benzonatate  (TESSALON ) 100 MG capsule Take 2 capsules (200 mg total) by mouth every 8 (eight) hours. 11/11/24   Bernardino Ditch, NP  diphenhydrAMINE  (BENADRYL ) 25 mg capsule Take by mouth.    [provider]  ibuprofen  (ADVIL ) 600 MG tablet Take by mouth. 02/26/15   [provider]  ipratropium (ATROVENT ) 0.06 % nasal spray Place 2 sprays into both nostrils 4 (four) times daily. 11/11/24   Bernardino Ditch, NP  loratadine (CLARITIN) 10 MG tablet Take by mouth.    [provider]  Multiple Vitamin (MULTI-VITAMIN) tablet Take by mouth.    [provider]  promethazine -dextromethorphan (PROMETHAZINE -DM) 6.25-15 MG/5ML syrup Take 5 mLs by mouth 4 (four) times daily as needed. 11/11/24   Bernardino Ditch, NP  ranitidine (ZANTAC) 150 MG tablet Take 1 tablet by mouth daily as needed.    [provider]  valACYclovir (VALTREX) 1000 MG tablet Take 1 tablet by mouth daily as needed. 10/01/16   [provider]  buPROPion (WELLBUTRIN XL) 150 MG 24 hr tablet Take by mouth. 12/16/19 11/03/20  [provider]    Family History Family History  Problem Relation Age of  Onset   Cervical cancer Paternal Grandmother 9   Lung cancer Paternal Grandfather    Rheum arthritis Mother    Brain cancer Mother    Hypertension Mother    Hypertension Father    Hyperlipidemia Father    Cervical cancer Paternal Aunt        20s, has contact    Social History Social History[1]   Allergies   Patient has no known allergies.   Review of Systems Review of Systems  Constitutional:  Negative for fever.  HENT:  Positive for congestion, ear pain, postnasal drip, rhinorrhea and sore throat.   Respiratory:  Positive for cough, shortness of breath and wheezing.      Physical Exam Triage Vital Signs ED Triage Vitals  Encounter Vitals Group     BP      Girls Systolic BP Percentile      Girls Diastolic BP Percentile      Boys Systolic BP Percentile      Boys Diastolic BP Percentile      Pulse      Resp      Temp      Temp src      SpO2      Weight      Height      Head Circumference      Peak Flow      Pain Score      Pain Loc      Pain Education      Exclude from Growth Chart    No data found.  Updated Vital Signs BP 112/81 (BP Location: Left Arm)   Pulse 90   Temp 97.9 F (36.6 C) (Oral)   Resp 16   Wt 168 lb 8 oz (76.4 kg)   LMP 10/27/2024 (Approximate)   SpO2 98%   BMI 30.82 kg/m   Visual Acuity Right Eye Distance:   Left Eye Distance:   Bilateral Distance:    Right Eye Near:   Left Eye Near:    Bilateral Near:     Physical Exam Vitals and nursing note reviewed.  Constitutional:      Appearance: Normal appearance. She is ill-appearing.  HENT:     Head: Normocephalic and atraumatic.     Right Ear: Tympanic membrane, ear canal and external ear normal. There is no impacted cerumen.     Left Ear: Tympanic membrane, ear canal and external ear normal. There is no impacted cerumen.     Nose: Congestion and rhinorrhea present.     Comments: Patient mucosa is edematous and erythematous with thick yellow discharge in both nares.     Mouth/Throat:     Mouth: Mucous membranes are moist.     Pharynx: Oropharynx is clear. Posterior oropharyngeal erythema present. No oropharyngeal exudate.     Comments: Tonsillar pillars are unremarkable.  Posterior oropharynx demonstrates erythema with  yellow postnasal drip. Cardiovascular:     Rate and Rhythm: Normal rate and regular rhythm.     Pulses: Normal pulses.     Heart sounds: Normal heart sounds. No murmur heard.    No friction rub. No gallop.  Pulmonary:     Effort: Pulmonary effort is normal.     Breath sounds: Normal breath sounds. No wheezing, rhonchi or rales.  Musculoskeletal:     Cervical back: Normal range of motion and neck supple. No tenderness.  Lymphadenopathy:     Cervical: No cervical adenopathy.  Skin:    General: Skin is warm and dry.     Capillary Refill: Capillary refill takes less than 2 seconds.     Findings: No rash.  Neurological:     General: No focal deficit present.     Mental Status: She is alert and oriented to person, place, and time.      UC Treatments / Results  Labs (all labs ordered are listed, but only abnormal results are displayed) Labs Reviewed  POCT RAPID STREP A (OFFICE) - Normal  CULTURE, GROUP A STREP [facility])    EKG   Radiology No results found.  Procedures Procedures (including critical care time)  Medications Ordered in UC Medications - No data to display  Initial Impression / Assessment and Plan / UC Course  I have reviewed the triage vital signs and the nursing notes.  Pertinent labs & imaging results that were available during my care of the patient were reviewed by me and considered in my medical decision making (see chart for details).   Patient is a pleasant, though mildly ill-appearing, 39 year old female presenting for evaluation of 5 days worth of respiratory symptoms as outlined in the HPI above.  Due to the fact that she is on 5 days of symptoms I would not test her for COVID or influenza.  The patient  also reports that she had influenza at the end of December around Christmas time.  She has had a coworker in her office with similar symptoms that was diagnosed with some form of bacterial infection and was out for several days last week.  Given 1 complaint is a sore throat I will order a rapid strep.  I do not feel a chest x-ray is warranted at this time as she is only endorsing an intermittently productive cough and her lungs are clear to auscultation all fields.  Rapid strep is negative.  I will send swab for culture.  I will discharge patient on the diagnosis of URI with cough and congestion.  I will start her on Augmentin  875 twice daily with food for 7 days.  Atrovent  nasal spray for nasal congestion.  Tessalon  Perles and Promethazine  DM cough syrup for cough and congestion.  Because she is experiencing shortness breath and wheezing I will also prescribe an albuterol  inhaler and spacer and she can do 1 or 2 puffs every 4-6 hours as needed for her symptoms management.  Return precautions reviewed.  Work note provided.   Final Clinical Impressions(s) / UC Diagnoses   Final diagnoses:  Acute pharyngitis, unspecified etiology  URI with cough and congestion     Discharge Instructions      Your strep test today was negative.  We will send the swab for culture.  Your exam is consistent with an upper respiratory tract infection.  Take the Augmentin  875 mg twice daily with food for 7 days for treatment of your URI.  Use the albuterol  inhaler, with the spacer,  and take 1 to 2 puffs every 4-6 hours as needed for shortness breath or wheezing.  You may use over-the-counter Tylenol  and/or ibuprofen  as you have a fever or pain.  Use the Atrovent  nasal spray, 2 squirts in each nostril every 6 hours, as needed for runny nose and postnasal drip.  Use the Tessalon  Perles every 8 hours during the day.  Take them with a small sip of water.  They may give you some numbness to the base of your tongue or a  metallic taste in your mouth, this is normal.  Use the Promethazine  DM cough syrup at bedtime for cough and congestion.  It will make you drowsy so do not take it during the day.  Return for reevaluation or see your primary care provider for any new or worsening symptoms.      ED Prescriptions     Medication Sig Dispense Auth. Provider   benzonatate  (TESSALON ) 100 MG capsule Take 2 capsules (200 mg total) by mouth every 8 (eight) hours. 21 capsule Bernardino Ditch, NP   ipratropium (ATROVENT ) 0.06 % nasal spray Place 2 sprays into both nostrils 4 (four) times daily. 15 mL Bernardino Ditch, NP   promethazine -dextromethorphan (PROMETHAZINE -DM) 6.25-15 MG/5ML syrup Take 5 mLs by mouth 4 (four) times daily as needed. 118 mL Bernardino Ditch, NP   Spacer/Aero-Holding Chambers (AEROCHAMBER MV) inhaler Use as instructed 1 each Bernardino Ditch, NP   albuterol  (VENTOLIN  HFA) 108 (90 Base) MCG/ACT inhaler Inhale 2 puffs into the lungs every 4 (four) hours as needed. 8 g Bernardino Ditch, NP   amoxicillin -clavulanate (AUGMENTIN ) 875-125 MG tablet Take 1 tablet by mouth every 12 (twelve) hours for 7 days. 14 tablet Bernardino Ditch, NP      PDMP not reviewed this encounter.     [1]  Social History Tobacco Use   Smoking status: Never   Smokeless tobacco: Never  Vaping Use   Vaping status: Never Used  Substance Use Topics   Alcohol use: No    Alcohol/week: 0.0 standard drinks of alcohol   Drug use: No     Bernardino Ditch, NP 11/11/24 0941  "

## 2024-11-11 NOTE — ED Triage Notes (Signed)
 Pt c/o cough,chest congestion & sore throat x5 days. Denies fevers. Has tried OTC meds w/o relief.

## 2024-11-11 NOTE — Discharge Instructions (Signed)
 Your strep test today was negative.  We will send the swab for culture.  Your exam is consistent with an upper respiratory tract infection.  Take the Augmentin  875 mg twice daily with food for 7 days for treatment of your URI.  Use the albuterol  inhaler, with the spacer, and take 1 to 2 puffs every 4-6 hours as needed for shortness breath or wheezing.  You may use over-the-counter Tylenol  and/or ibuprofen  as you have a fever or pain.  Use the Atrovent  nasal spray, 2 squirts in each nostril every 6 hours, as needed for runny nose and postnasal drip.  Use the Tessalon  Perles every 8 hours during the day.  Take them with a small sip of water.  They may give you some numbness to the base of your tongue or a metallic taste in your mouth, this is normal.  Use the Promethazine  DM cough syrup at bedtime for cough and congestion.  It will make you drowsy so do not take it during the day.  Return for reevaluation or see your primary care provider for any new or worsening symptoms.

## 2024-11-13 ENCOUNTER — Ambulatory Visit (HOSPITAL_COMMUNITY): Payer: Self-pay

## 2024-11-13 LAB — CULTURE, GROUP A STREP (THRC)
